# Patient Record
Sex: Female | Born: 1960 | Race: White | Hispanic: No | Smoking: Current every day smoker
Health system: Southern US, Community
[De-identification: ages and names within clinical notes are randomized; demographics above are authoritative.]

## PROBLEM LIST (undated history)

## (undated) DIAGNOSIS — I10 Essential (primary) hypertension: Secondary | ICD-10-CM

---

## 2006-02-02 ENCOUNTER — Inpatient Hospital Stay (HOSPITAL_COMMUNITY): Admission: AD | Admit: 2006-02-02 | Discharge: 2006-02-02 | Payer: Self-pay | Admitting: *Deleted

## 2006-09-16 ENCOUNTER — Emergency Department (HOSPITAL_COMMUNITY): Admission: EM | Admit: 2006-09-16 | Discharge: 2006-09-16 | Payer: Self-pay | Admitting: Family Medicine

## 2009-10-19 ENCOUNTER — Emergency Department (HOSPITAL_COMMUNITY): Admission: EM | Admit: 2009-10-19 | Discharge: 2009-10-19 | Payer: Self-pay | Admitting: Emergency Medicine

## 2017-06-05 ENCOUNTER — Encounter (HOSPITAL_COMMUNITY): Payer: Self-pay

## 2017-06-05 ENCOUNTER — Emergency Department (HOSPITAL_COMMUNITY): Payer: BLUE CROSS/BLUE SHIELD

## 2017-06-05 ENCOUNTER — Emergency Department (HOSPITAL_COMMUNITY)
Admission: EM | Admit: 2017-06-05 | Discharge: 2017-06-05 | Disposition: A | Discharge status: Home / Self Care | Payer: BLUE CROSS/BLUE SHIELD | Attending: Emergency Medicine | Admitting: Emergency Medicine

## 2017-06-05 DIAGNOSIS — N390 Urinary tract infection, site not specified: Secondary | ICD-10-CM | POA: Diagnosis not present

## 2017-06-05 DIAGNOSIS — F17200 Nicotine dependence, unspecified, uncomplicated: Secondary | ICD-10-CM | POA: Diagnosis not present

## 2017-06-05 DIAGNOSIS — M549 Dorsalgia, unspecified: Secondary | ICD-10-CM | POA: Diagnosis present

## 2017-06-05 LAB — URINALYSIS, ROUTINE W REFLEX MICROSCOPIC
Bilirubin Urine: NEGATIVE
GLUCOSE, UA: NEGATIVE mg/dL
HGB URINE DIPSTICK: NEGATIVE
Ketones, ur: 5 mg/dL — AB
Leukocytes, UA: NEGATIVE
NITRITE: POSITIVE — AB
PH: 6 (ref 5.0–8.0)
PROTEIN: NEGATIVE mg/dL
SPECIFIC GRAVITY, URINE: 1.011 (ref 1.005–1.030)

## 2017-06-05 LAB — COMPREHENSIVE METABOLIC PANEL
ALK PHOS: 59 U/L (ref 38–126)
ALT: 13 U/L — ABNORMAL LOW (ref 14–54)
ANION GAP: 6 (ref 5–15)
AST: 19 U/L (ref 15–41)
Albumin: 4 g/dL (ref 3.5–5.0)
BILIRUBIN TOTAL: 0.6 mg/dL (ref 0.3–1.2)
BUN: 13 mg/dL (ref 6–20)
CALCIUM: 8.9 mg/dL (ref 8.9–10.3)
CO2: 26 mmol/L (ref 22–32)
Chloride: 105 mmol/L (ref 101–111)
Creatinine, Ser: 0.69 mg/dL (ref 0.44–1.00)
GFR calc Af Amer: >60 mL/min (ref >60)
GLUCOSE: 105 mg/dL — AB (ref 65–99)
POTASSIUM: 3.7 mmol/L (ref 3.5–5.1)
Sodium: 137 mmol/L (ref 135–145)
TOTAL PROTEIN: 7 g/dL (ref 6.5–8.1)

## 2017-06-05 LAB — CBC WITH DIFFERENTIAL/PLATELET
BASOS PCT: 1 %
Basophils Absolute: 0.1 10*3/uL (ref 0.0–0.1)
EOS ABS: 0.1 10*3/uL (ref 0.0–0.7)
EOS PCT: 1 %
HCT: 42.8 % (ref 36.0–46.0)
HEMOGLOBIN: 14.9 g/dL (ref 12.0–15.0)
Lymphocytes Relative: 38 %
Lymphs Abs: 2.6 10*3/uL (ref 0.7–4.0)
MCH: 31.7 pg (ref 26.0–34.0)
MCHC: 34.8 g/dL (ref 30.0–36.0)
MCV: 91.1 fL (ref 78.0–100.0)
MONOS PCT: 8 %
Monocytes Absolute: 0.5 10*3/uL (ref 0.1–1.0)
NEUTROS PCT: 52 %
Neutro Abs: 3.6 10*3/uL (ref 1.7–7.7)
PLATELETS: 271 10*3/uL (ref 150–400)
RBC: 4.7 MIL/uL (ref 3.87–5.11)
RDW: 13.3 % (ref 11.5–15.5)
WBC: 6.8 10*3/uL (ref 4.0–10.5)

## 2017-06-05 LAB — LIPASE, BLOOD: LIPASE: 24 U/L (ref 11–51)

## 2017-06-05 LAB — D-DIMER, QUANTITATIVE: D-Dimer, Quant: 0.28 ug/mL-FEU (ref 0.00–0.50)

## 2017-06-05 LAB — TROPONIN I: Troponin I: <0.03 ng/mL (ref <0.03)

## 2017-06-05 MED ORDER — DEXTROSE 5 % IV SOLN
1.0000 g | Freq: Once | INTRAVENOUS | Status: AC
  Administered 2017-06-05: 1 g via INTRAVENOUS
  Filled 2017-06-05: qty 10

## 2017-06-05 MED ORDER — ONDANSETRON HCL 4 MG/2ML IJ SOLN
4.0000 mg | INTRAMUSCULAR | Status: DC | PRN
  Administered 2017-06-05: 4 mg via INTRAVENOUS
  Filled 2017-06-05: qty 2

## 2017-06-05 MED ORDER — FAMOTIDINE IN NACL 20-0.9 MG/50ML-% IV SOLN
20.0000 mg | Freq: Once | INTRAVENOUS | Status: AC
  Administered 2017-06-05: 20 mg via INTRAVENOUS
  Filled 2017-06-05: qty 50

## 2017-06-05 MED ORDER — ONDANSETRON 4 MG PO TBDP: 4.0000 mg | ORAL_TABLET | Freq: Three times a day (TID) | ORAL | 0 refills | Status: DC | PRN

## 2017-06-05 MED ORDER — CEPHALEXIN 500 MG PO CAPS: 500.0000 mg | ORAL_CAPSULE | Freq: Four times a day (QID) | ORAL | 0 refills | Status: DC

## 2017-06-05 MED ORDER — MORPHINE SULFATE (PF) 4 MG/ML IV SOLN
4.0000 mg | INTRAVENOUS | Status: DC | PRN
  Administered 2017-06-05: 4 mg via INTRAVENOUS
  Filled 2017-06-05: qty 1

## 2017-06-05 MED ORDER — HYDROCODONE-ACETAMINOPHEN 5-325 MG PO TABS: ORAL_TABLET | ORAL | 0 refills | Status: DC

## 2017-06-05 NOTE — ED Provider Notes (Signed)
[facility] Provider Note   CSN: [id number] Arrival date & time: 06/05/17  0702     History   Chief Complaint Chief Complaint  Patient presents with  . Back Pain    HPI Krista Wallace is a 56 y.o. female.   Back Pain      Pt was seen at 0725. Per pt, c/o gradual onset and worsening of persistent mid-back "pain" since yesterday morning. Pt describes the pain as "cramping." Has been associated with decreased PO intake, as well as several episodes of N/V. Denies abd pain, no CP/SOB, no diarrhea, no fevers, no black or blood in stools or emesis, no injury.    History reviewed. No pertinent past medical history.  There are no active problems to display for this patient.   History reviewed. No pertinent surgical history.  OB History    No data available       Home Medications    Prior to Admission medications   Not on File    Family History No family history on file.  Social History Social History  Substance Use Topics  . Smoking status: Current Every Day Smoker    Packs/day: 0.50  . Smokeless tobacco: Never Used  . Alcohol use No     Allergies   Patient has no allergy information on record.   Review of Systems Review of Systems  Musculoskeletal: Positive for back pain.  ROS: Statement: All systems negative except as marked or noted in the HPI; Constitutional: Negative for fever and chills. ; ; Eyes: Negative for eye pain, redness and discharge. ; ; ENMT: Negative for ear pain, hoarseness, nasal congestion, sinus pressure and sore throat. ; ; Cardiovascular: Negative for chest pain, palpitations, diaphoresis, dyspnea and peripheral edema. ; ; Respiratory: Negative for cough, wheezing and stridor. ; ; Gastrointestinal: +N/V. Negative for diarrhea, abdominal pain, blood in stool, hematemesis, jaundice and rectal bleeding. . ; ; Genitourinary: Negative for dysuria, flank pain and hematuria. ; ; Musculoskeletal: +back pain. Negative for neck pain.  Negative for swelling and trauma.; ; Skin: Negative for pruritus, rash, abrasions, blisters, bruising and skin lesion.; ; Neuro: Negative for headache, lightheadedness and neck stiffness. Negative for weakness, altered level of consciousness, altered mental status, extremity weakness, paresthesias, involuntary movement, seizure and syncope.        Physical Exam Updated Vital Signs BP 137/74 (BP Location: Right Arm)   Pulse (!) 58   Temp 97.8 F (36.6 C) (Oral)   Ht 5\' 2"  (1.575 m)   Wt 67.1 kg (148 lb)   SpO2 99%   BMI 27.07 kg/m   Physical Exam 0730: Physical examination:  Nursing notes reviewed; Vital signs and O2 SAT reviewed;  Constitutional: Well developed, Well nourished, Well hydrated, In no acute distress; Head:  Normocephalic, atraumatic; Eyes: EOMI, PERRL, No scleral icterus; ENMT: Mouth and pharynx normal, Mucous membranes moist; Neck: Supple, Full range of motion, No lymphadenopathy; Cardiovascular: Regular rate and rhythm, No gallop; Respiratory: Breath sounds clear & equal bilaterally, No wheezes.  Speaking full sentences with ease, Normal respiratory effort/excursion; Chest: Nontender, Movement normal; Abdomen: Soft, Nontender, Nondistended, Normal bowel sounds; Genitourinary: No CVA tenderness; Spine:  No midline CS, TS, LS tenderness. +mild TTP lower thoracic paraspinal muscles.;; Extremities: Pulses normal, No tenderness, No edema, No calf edema or asymmetry.; Neuro: AA&Ox3, Major CN grossly intact.  Speech clear. No gross focal motor or sensory deficits in extremities.; Skin: Color normal, Warm, Dry.   ED Treatments / Results  Labs (all labs ordered  are listed, but only abnormal results are displayed)   EKG  EKG Interpretation  Date/Time:  Friday June 05 2017 07:24:33 EDT Ventricular Rate:  64 PR Interval:    QRS Duration: 82 QT Interval:  408 QTC Calculation: 421 R Axis:   75 Text Interpretation:  Sinus rhythm No old tracing to compare Confirmed by Samuel Jester [phone]) on 06/05/2017 7:32:33 AM       Radiology   Procedures Procedures (including critical care time)  Medications Ordered in ED Medications  morphine 4 MG/ML injection 4 mg (not administered)  ondansetron (ZOFRAN) injection 4 mg (not administered)  famotidine (PEPCID) IVPB 20 mg premix (not administered)     Initial Impression / Assessment and Plan / ED Course  I have reviewed the triage vital signs and the nursing notes.  Pertinent labs & imaging results that were available during my care of the patient were reviewed by me and considered in my medical decision making (see chart for details).  MDM Reviewed: previous chart, nursing note and vitals Reviewed previous: labs and ECG Interpretation: labs, ECG and x-ray   Results for orders placed or performed during the hospital encounter of 06/05/17  Comprehensive metabolic panel  Result Value Ref Range   Sodium 137 135 - 145 mmol/L   Potassium 3.7 3.5 - 5.1 mmol/L   Chloride 105 101 - 111 mmol/L   CO2 26 22 - 32 mmol/L   Glucose, Bld 105 (H) 65 - 99 mg/dL   BUN 13 6 - 20 mg/dL   Creatinine, Ser 4.09 0.44 - 1.00 mg/dL   Calcium 8.9 8.9 - 81.1 mg/dL   Total Protein 7.0 6.5 - 8.1 g/dL   Albumin 4.0 3.5 - 5.0 g/dL   AST 19 15 - 41 U/L   ALT 13 (L) 14 - 54 U/L   Alkaline Phosphatase 59 38 - 126 U/L   Total Bilirubin 0.6 0.3 - 1.2 mg/dL   GFR calc non Af Amer >60 >60 mL/min   GFR calc Af Amer >60 >60 mL/min   Anion gap 6 5 - 15  Lipase, blood  Result Value Ref Range   Lipase 24 11 - 51 U/L  Troponin I  Result Value Ref Range   Troponin I <0.03 <0.03 ng/mL  CBC with Differential  Result Value Ref Range   WBC 6.8 4.0 - 10.5 K/uL   RBC 4.70 3.87 - 5.11 MIL/uL   Hemoglobin 14.9 12.0 - 15.0 g/dL   HCT 91.4 78.2 - 95.6 %   MCV 91.1 78.0 - 100.0 fL   MCH 31.7 26.0 - 34.0 pg   MCHC 34.8 30.0 - 36.0 g/dL   RDW 21.3 08.6 - 57.8 %   Platelets 271 150 - 400 K/uL   Neutrophils Relative % 52 %   Neutro Abs 3.6  1.7 - 7.7 K/uL   Lymphocytes Relative 38 %   Lymphs Abs 2.6 0.7 - 4.0 K/uL   Monocytes Relative 8 %   Monocytes Absolute 0.5 0.1 - 1.0 K/uL   Eosinophils Relative 1 %   Eosinophils Absolute 0.1 0.0 - 0.7 K/uL   Basophils Relative 1 %   Basophils Absolute 0.1 0.0 - 0.1 K/uL  Urinalysis, Routine w reflex microscopic  Result Value Ref Range   Color, Urine YELLOW YELLOW   APPearance HAZY (A) CLEAR   Specific Gravity, Urine 1.011 1.005 - 1.030   pH 6.0 5.0 - 8.0   Glucose, UA NEGATIVE NEGATIVE mg/dL   Hgb urine dipstick NEGATIVE NEGATIVE  Bilirubin Urine NEGATIVE NEGATIVE   Ketones, ur 5 (A) NEGATIVE mg/dL   Protein, ur NEGATIVE NEGATIVE mg/dL   Nitrite POSITIVE (A) NEGATIVE   Leukocytes, UA NEGATIVE NEGATIVE   RBC / HPF 0-5 0 - 5 RBC/hpf   WBC, UA 0-5 0 - 5 WBC/hpf   Bacteria, UA MANY (A) NONE SEEN   Squamous Epithelial / LPF 0-5 (A) NONE SEEN   Mucous PRESENT   D-dimer, quantitative  Result Value Ref Range   D-Dimer, Quant 0.28 0.00 - 0.50 ug/mL-FEU   Dg Chest 2 View Result Date: 06/05/2017 CLINICAL DATA:  Mid back pain EXAM: CHEST  2 VIEW COMPARISON:  None. FINDINGS: Normal heart size and mediastinal contours. No acute infiltrate or edema. No effusion or pneumothorax. No acute osseous findings. IMPRESSION: No evidence of active disease. Electronically Signed   By: Marnee SpringJonathon  Watts M.D.   On: 06/05/2017 08:41    1005:  Will tx for UTI with rocephin/keflex. Workup otherwise reassuring. Dx and testing d/w pt.  Questions answered.  Verb understanding, agreeable to d/c home with outpt f/u.   Final Clinical Impressions(s) / ED Diagnoses   Final diagnoses:  None    New Prescriptions New Prescriptions   No medications on file      Samuel JesterMcManus, Amandalee Lacap, DO 06/10/17 1517

## 2017-06-05 NOTE — ED Triage Notes (Addendum)
Pt reports that she woke up yesterday w mid pain soreness. Pain increased throughout day. Pt tried ibuprofen and bengay without relief. No injury. Reports vomiting yellow substance several. States she had several episodes of epigastric pain. Described as tightness

## 2017-06-05 NOTE — Discharge Instructions (Signed)
Take the prescriptions as directed.  Call your regular medical doctor today to schedule a follow up appointment within the next 3 days.  Return to the Emergency Department immediately sooner if worsening.  ° °

## 2020-07-24 ENCOUNTER — Ambulatory Visit
Admission: EM | Admit: 2020-07-24 | Discharge: 2020-07-24 | Disposition: A | Discharge status: Home / Self Care | Payer: BC Managed Care – PPO | Attending: Emergency Medicine | Admitting: Emergency Medicine

## 2020-07-24 ENCOUNTER — Other Ambulatory Visit: Payer: Self-pay

## 2020-07-24 DIAGNOSIS — R300 Dysuria: Secondary | ICD-10-CM | POA: Diagnosis present

## 2020-07-24 DIAGNOSIS — M545 Low back pain, unspecified: Secondary | ICD-10-CM

## 2020-07-24 LAB — POCT URINALYSIS DIP (MANUAL ENTRY)
Bilirubin, UA: NEGATIVE
Blood, UA: NEGATIVE
Glucose, UA: NEGATIVE mg/dL
Ketones, POC UA: NEGATIVE mg/dL
Leukocytes, UA: NEGATIVE
Nitrite, UA: NEGATIVE
Protein Ur, POC: NEGATIVE mg/dL
Spec Grav, UA: 1.025 (ref 1.010–1.025)
Urobilinogen, UA: 0.2 E.U./dL
pH, UA: 5.5 (ref 5.0–8.0)

## 2020-07-24 MED ORDER — PREDNISONE 20 MG PO TABS: 20.0000 mg | ORAL_TABLET | Freq: Two times a day (BID) | ORAL | 0 refills | Status: AC

## 2020-07-24 MED ORDER — DEXAMETHASONE SODIUM PHOSPHATE 10 MG/ML IJ SOLN
10.0000 mg | Freq: Once | INTRAMUSCULAR | Status: AC
  Administered 2020-07-24: 11:00:00 10 mg via INTRAMUSCULAR

## 2020-07-24 MED ORDER — CYCLOBENZAPRINE HCL 10 MG PO TABS: 10.0000 mg | ORAL_TABLET | Freq: Every day | ORAL | 0 refills | Status: DC

## 2020-07-24 NOTE — Discharge Instructions (Signed)
Urine without infection Steroid shot given Continue conservative management of rest, ice, and gentle stretches Prednisone prescribed.  Take as directed and to completion Take cyclobenzaprine at nighttime for symptomatic relief. Avoid driving or operating heavy machinery while using medication. Follow up with PCP if symptoms persist Return or go to the ER if you have any new or worsening symptoms (fever, chills, chest pain, abdominal pain, changes in bowel or bladder habits, pain radiating into lower legs, etc...)

## 2020-07-24 NOTE — ED Provider Notes (Signed)
[facility]   [id number] 07/24/20 Arrival Time: 1045  CC: back PAIN  SUBJECTIVE: History from: patient. Krista Wallace is a 59 y.o. female complains of low back pain x 4 days.  Denies a precipitating event or specific injury.  Localizes the pain to the mid to low back.  Describes the pain as constant and achy in character.  Has tried OTC medications without relief.  Denies aggravating factors.  Denies similar symptoms in the past.  Complains of slight dysuria.  Denies fever, chills, erythema, ecchymosis, effusion, weakness, numbness and tingling, saddle paresthesias, loss of bowel or bladder function, urinary frequency, urinary urgency.      ROS: As per HPI.  All other pertinent ROS negative.     History reviewed. No pertinent past medical history. History reviewed. No pertinent surgical history. No Known Allergies No current facility-administered medications on file prior to encounter.   Current Outpatient Medications on File Prior to Encounter  Medication Sig Dispense Refill  . ibuprofen (ADVIL,MOTRIN) 200 MG tablet Take 800 mg by mouth every 6 (six) hours as needed.    . Multiple Vitamins-Minerals (HAIR SKIN AND NAILS FORMULA PO) Take 1 tablet by mouth daily.    . ondansetron (ZOFRAN ODT) 4 MG disintegrating tablet Take 1 tablet (4 mg total) by mouth every 8 (eight) hours as needed for nausea or vomiting. 6 tablet 0   Social History   Socioeconomic History  . Marital status: Legally Separated    Spouse name: Not on file  . Number of children: Not on file  . Years of education: Not on file  . Highest education level: Not on file  Occupational History  . Not on file  Tobacco Use  . Smoking status: Current Every Day Smoker    Packs/day: 0.50  . Smokeless tobacco: Never Used  Substance and Sexual Activity  . Alcohol use: No  . Drug use: Not on file  . Sexual activity: Not on file  Other Topics Concern  . Not on file  Social History Narrative  . Not on file     Social Determinants of Health   Financial Resource Strain:   . Difficulty of Paying Living Expenses: Not on file  Food Insecurity:   . Worried About Programme researcher, broadcasting/film/video in the Last Year: Not on file  . Ran Out of Food in the Last Year: Not on file  Transportation Needs:   . Lack of Transportation (Medical): Not on file  . Lack of Transportation (Non-Medical): Not on file  Physical Activity:   . Days of Exercise per Week: Not on file  . Minutes of Exercise per Session: Not on file  Stress:   . Feeling of Stress : Not on file  Social Connections:   . Frequency of Communication with Friends and Family: Not on file  . Frequency of Social Gatherings with Friends and Family: Not on file  . Attends Religious Services: Not on file  . Active Member of Clubs or Organizations: Not on file  . Attends Banker Meetings: Not on file  . Marital Status: Not on file  Intimate Partner Violence:   . Fear of Current or Ex-Partner: Not on file  . Emotionally Abused: Not on file  . Physically Abused: Not on file  . Sexually Abused: Not on file   History reviewed. No pertinent family history.  OBJECTIVE:  Vitals:   07/24/20 1057  BP: 107/73  Pulse: 94  Resp: 18  Temp: 98.7 F (37.1 C)  SpO2: 96%    General appearance: ALERT; in no acute distress.  Head: NCAT Lungs: Normal respiratory effort Musculoskeletal: Back Inspection: Skin warm, dry, clear and intact without obvious erythema, effusion, or ecchymosis.  Palpation: diffusely TTP over mid to low back paravertebral muscles; no CVA tenderness ROM: FROM active and passive Strength: 5/5 shld abduction, 5/5 shld adduction, 5/5 elbow flexion, 5/5 elbow extension, 5/5 grip strength, 5/5 hip flexion, 5/5 hip extension Skin: warm and dry Neurologic: Ambulates without difficulty Psychological: alert and cooperative; normal mood and affect  LABS:  Results for orders placed or performed during the hospital encounter of 07/24/20  (from the past 24 hour(s))  POCT urinalysis dipstick     Status: None   Collection Time: 07/24/20 11:02 AM  Result Value Ref Range   Color, UA yellow yellow   Clarity, UA clear clear   Glucose, UA negative negative mg/dL   Bilirubin, UA negative negative   Ketones, POC UA negative negative mg/dL   Spec Grav, UA 4.098 1.191 - 1.025   Blood, UA negative negative   pH, UA 5.5 5.0 - 8.0   Protein Ur, POC negative negative mg/dL   Urobilinogen, UA 0.2 0.2 or 1.0 E.U./dL   Nitrite, UA Negative Negative   Leukocytes, UA Negative Negative     ASSESSMENT & PLAN:  1. Acute bilateral low back pain without sciatica   2. Dysuria     Meds ordered this encounter  Medications  . predniSONE (DELTASONE) 20 MG tablet    Sig: Take 1 tablet (20 mg total) by mouth 2 (two) times daily with a meal for 5 days.    Dispense:  10 tablet    Refill:  0    Order Specific Question:   Supervising Provider    Answer:   Eustace Moore [[id number]]  . cyclobenzaprine (FLEXERIL) 10 MG tablet    Sig: Take 1 tablet (10 mg total) by mouth at bedtime.    Dispense:  15 tablet    Refill:  0    Order Specific Question:   Supervising Provider    Answer:   Eustace Moore [[id number]]  . dexamethasone (DECADRON) injection 10 mg   Urine without infection Steroid shot given Continue conservative management of rest, ice, and gentle stretches Prednisone prescribed.  Take as directed and to completion Take cyclobenzaprine at nighttime for symptomatic relief. Avoid driving or operating heavy machinery while using medication. Follow up with PCP if symptoms persist Return or go to the ER if you have any new or worsening symptoms (fever, chills, chest pain, abdominal pain, changes in bowel or bladder habits, pain radiating into lower legs, etc...)   Reviewed expectations re: course of current medical issues. Questions answered. Outlined signs and symptoms indicating need for more acute intervention. Patient verbalized  understanding. After Visit Summary given.    Krista Harding, PA-C 07/24/20 1122

## 2020-07-24 NOTE — ED Triage Notes (Signed)
Pt presents with low back pain that she feels may be kidneys, states that is not muscular pain. No dysuria, symptoms began on Friday.

## 2020-07-25 LAB — URINE CULTURE

## 2021-02-21 ENCOUNTER — Ambulatory Visit
Admission: EM | Admit: 2021-02-21 | Discharge: 2021-02-21 | Disposition: A | Discharge status: Home / Self Care | Payer: BC Managed Care – PPO | Attending: Family Medicine | Admitting: Family Medicine

## 2021-02-21 ENCOUNTER — Other Ambulatory Visit: Payer: Self-pay

## 2021-02-21 ENCOUNTER — Encounter: Payer: Self-pay | Admitting: Emergency Medicine

## 2021-02-21 DIAGNOSIS — J0100 Acute maxillary sinusitis, unspecified: Secondary | ICD-10-CM

## 2021-02-21 DIAGNOSIS — J209 Acute bronchitis, unspecified: Secondary | ICD-10-CM | POA: Diagnosis not present

## 2021-02-21 MED ORDER — DOXYCYCLINE HYCLATE 100 MG PO CAPS: 100.0000 mg | ORAL_CAPSULE | Freq: Two times a day (BID) | ORAL | 0 refills | Status: AC

## 2021-02-21 MED ORDER — PREDNISONE 20 MG PO TABS: 20.0000 mg | ORAL_TABLET | Freq: Every day | ORAL | 0 refills | Status: AC

## 2021-02-21 NOTE — ED Provider Notes (Signed)
[facility]    CSN: [id number] Arrival date & time: 02/21/21  1729      History   Chief Complaint Chief Complaint  Patient presents with  . Otalgia    HPI Krista ROCCHIO is a 60 y.o. female.   HPI  Patient presents with concern for possible sinus infection. Onset: several days. Endorse facial pressure, ST, frontal headache, ear pain.  Denies fever, chest tightness, eye irritation,  or GI symptoms. Attempted relief with OTC medication without any relief of symptoms. Patient is a smoker.    Remainder of Review of Systems negative except as noted in the HPI.   History reviewed. No pertinent past medical history.  There are no problems to display for this patient.   History reviewed. No pertinent surgical history.  OB History   No obstetric history on file.      Home Medications    Prior to Admission medications   Medication Sig Start Date End Date Taking? Authorizing Provider  doxycycline (VIBRAMYCIN) 100 MG capsule Take 1 capsule (100 mg total) by mouth 2 (two) times daily for 10 days. 02/21/21 03/03/21 Yes Bing Neighbors, FNP  predniSONE (DELTASONE) 20 MG tablet Take 1 tablet (20 mg total) by mouth daily with breakfast for 5 days. 02/21/21 02/26/21 Yes Bing Neighbors, FNP  cyclobenzaprine (FLEXERIL) 10 MG tablet Take 1 tablet (10 mg total) by mouth at bedtime. 07/24/20   Wurst, [place], PA-C  ibuprofen (ADVIL,MOTRIN) 200 MG tablet Take 800 mg by mouth every 6 (six) hours as needed.    [provider]  Multiple Vitamins-Minerals (HAIR SKIN AND NAILS FORMULA PO) Take 1 tablet by mouth daily.    [provider]  ondansetron (ZOFRAN ODT) 4 MG disintegrating tablet Take 1 tablet (4 mg total) by mouth every 8 (eight) hours as needed for nausea or vomiting. 06/05/17   Samuel Jester, DO    Family History No family history on file.  Social History Social History   Tobacco Use  . Smoking status: Current Every Day Smoker     Packs/day: 0.50  . Smokeless tobacco: Never Used  Substance Use Topics  . Alcohol use: No  . Drug use: Never     Allergies   Patient has no known allergies.   Review of Systems Review of Systems Pertinent negatives listed in HPI   Physical Exam Triage Vital Signs ED Triage Vitals  Enc Vitals Group     BP 02/21/21 1743 105/70     Pulse Rate 02/21/21 1743 (!) 107     Resp 02/21/21 1743 18     Temp 02/21/21 1743 98.8 F (37.1 C)     Temp Source 02/21/21 1743 Oral     SpO2 02/21/21 1743 93 %     Weight --      Height --      Head Circumference --      Peak Flow --      Pain Score 02/21/21 1742 4     Pain Loc --      Pain Edu? --      Excl. in GC? --    No data found.  Updated Vital Signs BP 105/70 (BP Location: Right Arm)   Pulse (!) 107   Temp 98.8 F (37.1 C) (Oral)   Resp 18   SpO2 93%   Visual Acuity Right Eye Distance:   Left Eye Distance:   Bilateral Distance:    Right Eye Near:   Left Eye Near:  Bilateral Near:     Physical Exam  General Appearance:    Alert, cooperative, no distress  HENT:   Normocephalic, ears normal, nares mucosal edema with congestion, rhinorrhea, oropharynx clear   Eyes:    PERRL, conjunctiva/corneas clear, EOM's intact       Lungs:    Coarse lung sounds without audible wheezing, respirations unlabored  Heart:    Regular rate and rhythm  Neurologic:   Awake, alert, oriented x 3. No apparent focal neurological           defect.      UC Treatments / Results  Labs (all labs ordered are listed, but only abnormal results are displayed) Labs Reviewed - No data to display  EKG   Radiology No results found.  Procedures Procedures (including critical care time)  Medications Ordered in UC Medications - No data to display  Initial Impression / Assessment and Plan / UC Course  I have reviewed the triage vital signs and the nursing notes.  Pertinent labs & imaging results that were available during my care of the  patient were reviewed by me and considered in my medical decision making (see chart for details).    Acute sinusitis with bronchitis.  Treatment per discharge medication orders.  Force fluids.  ER precautions if develops severe shortness of breath or wheezing.  Follow-up with PCP as needed.  Final Clinical Impressions(s) / UC Diagnoses   Final diagnoses:  Acute bronchitis, unspecified organism  Acute non-recurrent maxillary sinusitis   Discharge Instructions   None    ED Prescriptions    Medication Sig Dispense Auth. Provider   doxycycline (VIBRAMYCIN) 100 MG capsule Take 1 capsule (100 mg total) by mouth 2 (two) times daily for 10 days. 20 capsule Bing Neighbors, FNP   predniSONE (DELTASONE) 20 MG tablet Take 1 tablet (20 mg total) by mouth daily with breakfast for 5 days. 5 tablet Bing Neighbors, FNP     PDMP not reviewed this encounter.   Bing Neighbors, FNP 02/25/21 2051

## 2021-02-21 NOTE — ED Triage Notes (Signed)
Sinus headache, sore throat and LT ear pain for past few days. Reports she has used Claritin, sudafed with no relief.

## 2021-07-17 ENCOUNTER — Other Ambulatory Visit: Payer: Self-pay

## 2021-07-17 ENCOUNTER — Ambulatory Visit
Admission: EM | Admit: 2021-07-17 | Discharge: 2021-07-17 | Disposition: A | Discharge status: Home / Self Care | Payer: BC Managed Care – PPO

## 2021-07-17 ENCOUNTER — Encounter: Payer: Self-pay | Admitting: *Deleted

## 2021-07-17 DIAGNOSIS — K439 Ventral hernia without obstruction or gangrene: Secondary | ICD-10-CM

## 2021-07-17 NOTE — ED Triage Notes (Signed)
Pt reports a painful knot that is internal and located above the belly button. On observation no raised skin  seen. No redness noted on skin

## 2021-07-17 NOTE — Discharge Instructions (Addendum)
Go to the Emergency department if increased swelling or redness

## 2021-07-19 NOTE — ED Provider Notes (Signed)
[facility]    CSN: [id number] Arrival date & time: 07/17/21  1547      History   Chief Complaint Chief Complaint  Patient presents with   internal painful knot     HPI Krista Wallace is a 60 y.o. female.   Pt complains of a knot in her upper abdomen.  Pt complains of soreness  The history is provided by the patient.  Abdominal Pain Pain location:  Generalized Pain quality: aching   Pain radiates to:  Does not radiate Pain severity:  Moderate Onset quality:  Gradual Timing:  Constant Progression:  Worsening Chronicity:  New Relieved by:  Nothing Worsened by:  Nothing Ineffective treatments:  None tried Associated symptoms: no fever and no vomiting    History reviewed. No pertinent past medical history.  There are no problems to display for this patient.   History reviewed. No pertinent surgical history.  OB History   No obstetric history on file.      Home Medications    Prior to Admission medications   Medication Sig Start Date End Date Taking? Authorizing Provider  cyclobenzaprine (FLEXERIL) 10 MG tablet Take 1 tablet (10 mg total) by mouth at bedtime. 07/24/20   Wurst, [place], PA-C  ibuprofen (ADVIL,MOTRIN) 200 MG tablet Take 800 mg by mouth every 6 (six) hours as needed.    [provider]  Multiple Vitamins-Minerals (HAIR SKIN AND NAILS FORMULA PO) Take 1 tablet by mouth daily.    [provider]  ondansetron (ZOFRAN ODT) 4 MG disintegrating tablet Take 1 tablet (4 mg total) by mouth every 8 (eight) hours as needed for nausea or vomiting. 06/05/17   Samuel Jester, DO    Family History History reviewed. No pertinent family history.  Social History Social History   Tobacco Use   Smoking status: Every Day    Packs/day: 0.50    Types: Cigarettes   Smokeless tobacco: Never  Substance Use Topics   Alcohol use: No   Drug use: Never     Allergies   Other   Review of Systems Review of Systems   Constitutional:  Negative for fever.  Gastrointestinal:  Positive for abdominal pain. Negative for vomiting.  All other systems reviewed and are negative.   Physical Exam Triage Vital Signs ED Triage Vitals [07/17/21 1626]  Enc Vitals Group     BP 131/87     Pulse Rate 78     Resp 18     Temp 98.3 F (36.8 C)     Temp src      SpO2 95 %     Weight      Height      Head Circumference      Peak Flow      Pain Score 7     Pain Loc      Pain Edu?      Excl. in GC?    No data found.  Updated Vital Signs BP 131/87   Pulse 78   Temp 98.3 F (36.8 C)   Resp 18   SpO2 95%   Visual Acuity Right Eye Distance:   Left Eye Distance:   Bilateral Distance:    Right Eye Near:   Left Eye Near:    Bilateral Near:     Physical Exam Vitals reviewed.  Cardiovascular:     Rate and Rhythm: Normal rate.  Pulmonary:     Effort: Pulmonary effort is normal.  Abdominal:     General: Abdomen is  flat.     Tenderness: There is abdominal tenderness.  Skin:    General: Skin is warm.  Neurological:     General: No focal deficit present.     Mental Status: She is alert.  Psychiatric:        Mood and Affect: Mood normal.     UC Treatments / Results  Labs (all labs ordered are listed, but only abnormal results are displayed) Labs Reviewed - No data to display  EKG   Radiology No results found.  Procedures Procedures (including critical care time)  Medications Ordered in UC Medications - No data to display  Initial Impression / Assessment and Plan / UC Course  I have reviewed the triage vital signs and the nursing notes.  Pertinent labs & imaging results that were available during my care of the patient were reviewed by me and considered in my medical decision making (see chart for details).     MDM:  Pt counseled on hernia's.  Pt advised to schedule to see Surgeon for evaluation, Pt advised to go to ED if area develops redness, increased bulging or pain Final  Clinical Impressions(s) / UC Diagnoses   Final diagnoses:  Hernia of abdominal wall     Discharge Instructions      Go to the Emergency department if increased swelling or redness    ED Prescriptions   None    PDMP not reviewed this encounter. An After Visit Summary was printed and given to the patient.    Elson Areas, [place] 07/19/21 1255

## 2021-08-01 ENCOUNTER — Encounter: Payer: Self-pay | Admitting: General Surgery

## 2021-08-01 ENCOUNTER — Other Ambulatory Visit: Payer: Self-pay

## 2021-08-01 ENCOUNTER — Ambulatory Visit: Payer: BC Managed Care – PPO | Admitting: General Surgery

## 2021-08-01 VITALS — BP 123/76 | HR 82 | Temp 98.5°F | Resp 16 | Ht 62.0 in | Wt 173.0 lb

## 2021-08-01 DIAGNOSIS — R1033 Periumbilical pain: Secondary | ICD-10-CM | POA: Diagnosis not present

## 2021-08-01 NOTE — Progress Notes (Signed)
[facility] History and Physical  Reason for Referral: Epigastric pain/ supraumbilical pain, possible hernia  Referring Physician: ED   Chief Complaint   New Patient (Initial Visit)     Krista Wallace is a 60 y.o. female.  HPI: Krista Wallace is a 60 yo who went to the ED for upper abdominal pain and some associated SOB. She felt this knot at the top her her abdomen and the ED felt like it was a hernia. She had no imaging. She was sent to me for further management. She says that the area is still present but it is only painful with palpation at this time.    History reviewed. No pertinent past medical history.  History reviewed. No pertinent surgical history.  History reviewed. No pertinent family history.  Social History   Tobacco Use   Smoking status: Every Day    Packs/day: 0.50    Types: Cigarettes   Smokeless tobacco: Never  Substance Use Topics   Alcohol use: No   Drug use: Never    Medications: I have reviewed the patient's current medications. Allergies as of 08/01/2021       Reactions   Other Anaphylaxis   OSB Board        Medication List        Accurate as of August 01, 2021 11:59 PM. If you have any questions, ask your nurse or doctor.          cyclobenzaprine 10 MG tablet Commonly known as: FLEXERIL Take 1 tablet (10 mg total) by mouth at bedtime.   HAIR SKIN AND NAILS FORMULA PO Take 1 tablet by mouth daily.   ibuprofen 200 MG tablet Commonly known as: ADVIL Take 800 mg by mouth every 6 (six) hours as needed.   ondansetron 4 MG disintegrating tablet Commonly known as: Zofran ODT Take 1 tablet (4 mg total) by mouth every 8 (eight) hours as needed for nausea or vomiting.         ROS:  A comprehensive review of systems was negative except for: Gastrointestinal: positive for knot in the supraumbilical region  Blood pressure 123/76, pulse 82, temperature 98.5 F (36.9 C), temperature source Other (Comment),  resp. rate 16, height 5\' 2"  (1.575 m), weight 173 lb (78.5 kg), SpO2 92 %. Physical Exam Vitals reviewed.  Constitutional:      Appearance: Normal appearance.  HENT:     Head: Normocephalic.     Nose: Nose normal.     Mouth/Throat:     Mouth: Mucous membranes are moist.  Eyes:     Extraocular Movements: Extraocular movements intact.  Cardiovascular:     Rate and Rhythm: Normal rate and regular rhythm.  Pulmonary:     Effort: Pulmonary effort is normal.     Breath sounds: Normal breath sounds.  Abdominal:     General: There is no distension.     Palpations: Abdomen is soft.     Tenderness: There is no abdominal tenderness.     Comments: 2cm knot/ palpable mass like area but not able to reduce, cannot feel any obvious fascial edges, no definitive hernia noted   Musculoskeletal:        General: Normal range of motion.     Cervical back: Normal range of motion.  Skin:    General: Skin is warm.  Neurological:     General: No focal deficit present.     Mental Status: She is alert and oriented to person, place, and time.  Psychiatric:  Mood and Affect: Mood normal.        Behavior: Behavior normal.        Thought Content: Thought content normal.    Results: None   Assessment & Plan:  Krista Wallace is a 60 y.o. female with a knot in the supraumbilical region that is not definitive for a hernia. This could just be a lipoma. I cannot get this to reduce.  Will get an ultrasound to assess the area. Will call with results and discuss next steps.  Avoid lifting for now given risk of hernia.     All questions were answered to the satisfaction of the patient.     [name] 08/05/2021, 9:20 AM

## 2021-08-01 NOTE — Patient Instructions (Addendum)
Will get an ultrasound to assess the area. Will call with results and discuss next steps.  Avoid lifting for now given risk of hernia.

## 2021-08-05 ENCOUNTER — Encounter: Payer: Self-pay | Admitting: General Surgery

## 2021-08-14 ENCOUNTER — Ambulatory Visit (HOSPITAL_COMMUNITY)
Admission: RE | Admit: 2021-08-14 | Discharge: 2021-08-14 | Disposition: A | Discharge status: Home / Self Care | Payer: BC Managed Care – PPO | Source: Ambulatory Visit | Attending: General Surgery | Admitting: General Surgery

## 2021-08-14 ENCOUNTER — Other Ambulatory Visit: Payer: Self-pay

## 2021-08-14 DIAGNOSIS — R1033 Periumbilical pain: Secondary | ICD-10-CM | POA: Diagnosis not present

## 2021-08-16 ENCOUNTER — Telehealth (INDEPENDENT_AMBULATORY_CARE_PROVIDER_SITE_OTHER): Payer: BC Managed Care – PPO | Admitting: General Surgery

## 2021-08-16 DIAGNOSIS — K439 Ventral hernia without obstruction or gangrene: Secondary | ICD-10-CM | POA: Insufficient documentation

## 2021-08-16 NOTE — Telephone Encounter (Signed)
[facility]  Notified of supraumbilical hernia, wants to get scheduled for surgery. Office will call and get scheduled.   CLINICAL DATA:  Periumbilical pain.   EXAM: ULTRASOUND ABDOMEN LIMITED   COMPARISON:  Chest radiograph, 06/05/2017.   FINDINGS: Focused ultrasound of the abdomen, limited to the subcutaneous tissues above the umbilicus.   Images acquired demonstrating a fascial boundary with approximately 1.2 cm defect and circumscribed ovoid heterogeneously hypoechoic "mushroom" lesion measuring up to 3.7 cm in greatest axial dimension. See key image.   IMPRESSION: 1.2 cm fascial defect with 3.7 cm mushrooming lesion, at the area of palpable concern on the abdomen.   Findings favored consistent with fat-containing supraumbilical hernia.   Roanna Banning, MD   [facility]   [facility]     Electronically Signed   By: Roanna Banning M.D.   On: 08/15/2021 08:35

## 2021-08-22 ENCOUNTER — Ambulatory Visit
Admission: EM | Admit: 2021-08-22 | Discharge: 2021-08-22 | Disposition: A | Discharge status: Home / Self Care | Payer: BC Managed Care – PPO | Attending: Physician Assistant | Admitting: Physician Assistant

## 2021-08-22 ENCOUNTER — Other Ambulatory Visit: Payer: Self-pay

## 2021-08-22 ENCOUNTER — Encounter: Payer: Self-pay | Admitting: Emergency Medicine

## 2021-08-22 DIAGNOSIS — B349 Viral infection, unspecified: Secondary | ICD-10-CM | POA: Diagnosis not present

## 2021-08-22 NOTE — ED Triage Notes (Signed)
Patient c/o productive cough and nasal congestion x 1 week.   Patient endorses chills at night.   Patient endorses chest congestion.   Patient endorses fatigue.   Patient endorses SOB during coughing.   Patient has used Mucinex DM with some relief of congestion.

## 2021-08-22 NOTE — Discharge Instructions (Signed)
Return if any problems.  Your covid and influenza are pending

## 2021-08-23 LAB — COVID-19, FLU A+B NAA
Influenza A, NAA: NOT DETECTED
Influenza B, NAA: NOT DETECTED
SARS-CoV-2, NAA: NOT DETECTED

## 2021-08-23 NOTE — ED Provider Notes (Signed)
[facility]    CSN: [id number] Arrival date & time: 08/22/21  1046      History   Chief Complaint Chief Complaint  Patient presents with   Cough   Nasal Congestion    HPI Krista Wallace is a 60 y.o. female.   The history is provided by the patient. No language interpreter was used.  Cough Cough characteristics:  Non-productive Sputum characteristics:  Nondescript Severity:  Moderate Onset quality:  Gradual Timing:  Constant Progression:  Worsening Chronicity:  New Smoker: no   Context: upper respiratory infection   Relieved by:  Nothing Worsened by:  Nothing Ineffective treatments:  None tried Associated symptoms: no fever   Risk factors: no recent infection    History reviewed. No pertinent past medical history.  Patient Active Problem List   Diagnosis Date Noted   Supraumbilical hernia 08/16/2021   Periumbilical abdominal pain 08/01/2021    History reviewed. No pertinent surgical history.  OB History   No obstetric history on file.      Home Medications    Prior to Admission medications   Medication Sig Start Date End Date Taking? Authorizing Provider  cyclobenzaprine (FLEXERIL) 10 MG tablet Take 1 tablet (10 mg total) by mouth at bedtime. 07/24/20   Wurst, [place], PA-C  ibuprofen (ADVIL,MOTRIN) 200 MG tablet Take 800 mg by mouth every 6 (six) hours as needed.    [provider]  Multiple Vitamins-Minerals (HAIR SKIN AND NAILS FORMULA PO) Take 1 tablet by mouth daily.    [provider]  ondansetron (ZOFRAN ODT) 4 MG disintegrating tablet Take 1 tablet (4 mg total) by mouth every 8 (eight) hours as needed for nausea or vomiting. 06/05/17   Samuel Jester, DO    Family History History reviewed. No pertinent family history.  Social History Social History   Tobacco Use   Smoking status: Every Day    Packs/day: 0.50    Types: Cigarettes   Smokeless tobacco: Never  Substance Use Topics   Alcohol use: No    Drug use: Never     Allergies   Other   Review of Systems Review of Systems  Constitutional:  Negative for fever.  Respiratory:  Positive for cough.   All other systems reviewed and are negative.   Physical Exam Triage Vital Signs ED Triage Vitals  Enc Vitals Group     BP 08/22/21 1258 123/83     Pulse Rate 08/22/21 1258 69     Resp 08/22/21 1258 18     Temp 08/22/21 1258 98 F (36.7 C)     Temp Source 08/22/21 1258 Oral     SpO2 08/22/21 1258 94 %     Weight --      Height --      Head Circumference --      Peak Flow --      Pain Score 08/22/21 1259 0     Pain Loc --      Pain Edu? --      Excl. in GC? --    No data found.  Updated Vital Signs BP 123/83 (BP Location: Right Arm)   Pulse 69   Temp 98 F (36.7 C) (Oral)   Resp 18   SpO2 94%   Visual Acuity Right Eye Distance:   Left Eye Distance:   Bilateral Distance:    Right Eye Near:   Left Eye Near:    Bilateral Near:     Physical Exam Vitals reviewed.  Constitutional:  Appearance: Normal appearance.  HENT:     Right Ear: Tympanic membrane normal.     Left Ear: Tympanic membrane normal.     Mouth/Throat:     Mouth: Mucous membranes are moist.  Eyes:     Pupils: Pupils are equal, round, and reactive to light.  Cardiovascular:     Rate and Rhythm: Normal rate.  Pulmonary:     Effort: Pulmonary effort is normal.  Abdominal:     General: Abdomen is flat.  Musculoskeletal:     Cervical back: Normal range of motion.  Skin:    General: Skin is warm.  Neurological:     General: No focal deficit present.     Mental Status: She is alert.  Psychiatric:        Mood and Affect: Mood normal.     UC Treatments / Results  Labs (all labs ordered are listed, but only abnormal results are displayed) Labs Reviewed  COVID-19, FLU A+B NAA    EKG   Radiology No results found.  Procedures Procedures (including critical care time)  Medications Ordered in UC Medications - No data to  display  Initial Impression / Assessment and Plan / UC Course  I have reviewed the triage vital signs and the nursing notes.  Pertinent labs & imaging results that were available during my care of the patient were reviewed by me and considered in my medical decision making (see chart for details).     MDM:  covid and flu pending Final Clinical Impressions(s) / UC Diagnoses   Final diagnoses:  Viral illness     Discharge Instructions      Return if any problems.  Your covid and influenza are pending    ED Prescriptions   None    An After Visit Summary was printed and given to the patient.  PDMP not reviewed this encounter.   Elson Areas, [place] 08/23/21 [id number]

## 2021-10-11 ENCOUNTER — Encounter (HOSPITAL_COMMUNITY): Payer: Self-pay | Admitting: Anesthesiology

## 2021-10-18 NOTE — Patient Instructions (Signed)
Krista Wallace  10/18/2021     @PREFPERIOPPHARMACY @   Your procedure is scheduled on  10/25/2021.   Report to [facility] at  1100  A.M.   Call this number if you have problems the morning of surgery:  [phone]   Remember:  Do not eat or drink after midnight.      Take these medicines the morning of surgery with A SIP OF WATER                         flexeril(if needed), zofran (if needed).     Do not wear jewelry, make-up or nail polish.  Do not wear lotions, powders, or perfumes, or deodorant.  Do not shave 48 hours prior to surgery.  Men may shave face and neck.  Do not bring valuables to the hospital.  [facility] is not responsible for any belongings or valuables.  Contacts, dentures or bridgework may not be worn into surgery.  Leave your suitcase in the car.  After surgery it may be brought to your room.  For patients admitted to the hospital, discharge time will be determined by your treatment team.  Patients discharged the day of surgery will not be allowed to drive home and must have someone with them for 24 hours.    Special instructions:   DO NOT smoke tobacco or vape for 24 hours before your procedure.  Please read over the following fact sheets that you were given. Coughing and Deep Breathing, Surgical Site Infection Prevention, Anesthesia Post-op Instructions, and Care and Recovery After Surgery      Open Hernia Repair, Adult, Care After What can I expect after the procedure? After the procedure, it is common to have: Mild discomfort. Slight bruising. Mild swelling. Pain in the belly (abdomen). A small amount of blood from the cut from surgery (incision). Follow these instructions at home: Your doctor may give you more specific instructions. If you have problems, call your doctor. Medicines Take over-the-counter and prescription medicines only as told by your doctor. If told, take steps to prevent problems with pooping  (constipation). You may need to: Drink enough fluid to keep your pee (urine) pale yellow. Take medicines. You will be told what medicines to take. Eat foods that are high in fiber. These include beans, whole grains, and fresh fruits and vegetables. Limit foods that are high in fat and sugar. These include fried or sweet foods. Ask your doctor if you should avoid driving or using machines while you are taking your medicine. Incision care  Follow instructions from your doctor about how to take care of your incision. Make sure you: Wash your hands with soap and water for at least 20 seconds before and after you change your bandage (dressing). If you cannot use soap and water, use hand sanitizer. Change your bandage. Leave stitches or skin glue in place for at least 2 weeks. Leave tape strips alone unless you are told to take them off. You may trim the edges of the tape strips if they curl up. Check your incision every day for signs of infection. Check for: More redness, swelling, or pain. More fluid or blood. Warmth. Pus or a bad smell. Wear loose, soft clothing while your incision heals. Activity  Rest as told by your doctor. Do not lift anything that is heavier than 10 lb (4.5 kg), or the limit that you are told. Do not  play contact sports until your doctor says that this is safe. If you were given a sedative during your procedure, do not drive or use machines until your doctor says that it is safe. A sedative is a medicine that helps you relax. Return to your normal activities when your doctor says that it is safe. General instructions Do not take baths, swim, or use a hot tub. Ask your doctor about taking showers or sponge baths. Hold a pillow over your belly when you cough or sneeze. This helps with pain. Do not smoke or use any products that contain nicotine or tobacco. If you need help quitting, ask your doctor. Keep all follow-up visits. Contact a doctor if: You have any of these  signs of infection in or around your incision: More redness, swelling, or pain. More fluid or blood. Warmth. Pus. A bad smell. You have a fever or chills. You have blood in your poop (stool). You have not pooped (had a bowel movement) in 2-3 days. Medicine does not help your pain. Get help right away if: You have chest pain, or you are short of breath. You feel faint or light-headed. You have very bad pain. You vomit and your pain is worse. You have pain, swelling, or redness in a leg. These symptoms may be an emergency. Get help right away. Call your local emergency services (911 in the [place] Do not wait to see if the symptoms will go away. Do not drive yourself to the hospital. Summary After this procedure, it is common to have mild discomfort, slight bruising, and mild swelling. Follow instructions from your doctor about how to take care of your cut from surgery (incision). Check every day for signs of infection. Do not lift heavy objects or play contact sports until your doctor says it is safe. Return to your normal activities as told by your doctor. This information is not intended to replace advice given to you by your health care provider. Make sure you discuss any questions you have with your health care provider. Document Revised: 05/28/2020 Document Reviewed: 05/28/2020 Elsevier Patient Education  2022 [employer] General Anesthesia, Adult, Care After This sheet gives you information about how to care for yourself after your procedure. Your health care provider may also give you more specific instructions. If you have problems or questions, contact your health care provider. What can I expect after the procedure? After the procedure, the following side effects are common: Pain or discomfort at the IV site. Nausea. Vomiting. Sore throat. Trouble concentrating. Feeling cold or chills. Feeling weak or tired. Sleepiness and fatigue. Soreness and body aches. These side  effects can affect parts of the body that were not involved in surgery. Follow these instructions at home: For the time period you were told by your health care provider:  Rest. Do not participate in activities where you could fall or become injured. Do not drive or use machinery. Do not drink alcohol. Do not take sleeping pills or medicines that cause drowsiness. Do not make important decisions or sign legal documents. Do not take care of children on your own. Eating and drinking Follow any instructions from your health care provider about eating or drinking restrictions. When you feel hungry, start by eating small amounts of foods that are soft and easy to digest (bland), such as toast. Gradually return to your regular diet. Drink enough fluid to keep your urine pale yellow. If you vomit, rehydrate by drinking water, juice, or clear broth. General instructions If  you have sleep apnea, surgery and certain medicines can increase your risk for breathing problems. Follow instructions from your health care provider about wearing your sleep device: Anytime you are sleeping, including during daytime naps. While taking prescription pain medicines, sleeping medicines, or medicines that make you drowsy. Have a responsible adult stay with you for the time you are told. It is important to have someone help care for you until you are awake and alert. Return to your normal activities as told by your health care provider. Ask your health care provider what activities are safe for you. Take over-the-counter and prescription medicines only as told by your health care provider. If you smoke, do not smoke without supervision. Keep all follow-up visits as told by your health care provider. This is important. Contact a health care provider if: You have nausea or vomiting that does not get better with medicine. You cannot eat or drink without vomiting. You have pain that does not get better with  medicine. You are unable to pass urine. You develop a skin rash. You have a fever. You have redness around your IV site that gets worse. Get help right away if: You have difficulty breathing. You have chest pain. You have blood in your urine or stool, or you vomit blood. Summary After the procedure, it is common to have a sore throat or nausea. It is also common to feel tired. Have a responsible adult stay with you for the time you are told. It is important to have someone help care for you until you are awake and alert. When you feel hungry, start by eating small amounts of foods that are soft and easy to digest (bland), such as toast. Gradually return to your regular diet. Drink enough fluid to keep your urine pale yellow. Return to your normal activities as told by your health care provider. Ask your health care provider what activities are safe for you. This information is not intended to replace advice given to you by your health care provider. Make sure you discuss any questions you have with your health care provider. Document Revised: 06/28/2020 Document Reviewed: 01/26/2020 Elsevier Patient Education  2022 [employer] Inc. How to Use Chlorhexidine for Bathing Chlorhexidine gluconate (CHG) is a germ-killing (antiseptic) solution that is used to clean the skin. It can get rid of the bacteria that normally live on the skin and can keep them away for about 24 hours. To clean your skin with CHG, you may be given: A CHG solution to use in the shower or as part of a sponge bath. A prepackaged cloth that contains CHG. Cleaning your skin with CHG may help lower the risk for infection: While you are staying in the intensive care unit of the hospital. If you have a vascular access, such as a central line, to provide short-term or long-term access to your veins. If you have a catheter to drain urine from your bladder. If you are on a ventilator. A ventilator is a machine that helps you breathe  by moving air in and out of your lungs. After surgery. What are the risks? Risks of using CHG include: A skin reaction. Hearing loss, if CHG gets in your ears and you have a perforated eardrum. Eye injury, if CHG gets in your eyes and is not rinsed out. The CHG product catching fire. Make sure that you avoid smoking and flames after applying CHG to your skin. Do not use CHG: If you have a chlorhexidine allergy or have previously  reacted to chlorhexidine. On babies younger than 75 months of age. How to use CHG solution Use CHG only as told by your health care provider, and follow the instructions on the label. Use the full amount of CHG as directed. Usually, this is one bottle. During a shower Follow these steps when using CHG solution during a shower (unless your health care provider gives you different instructions): Start the shower. Use your normal soap and shampoo to wash your face and hair. Turn off the shower or move out of the shower stream. Pour the CHG onto a clean washcloth. Do not use any type of brush or rough-edged sponge. Starting at your neck, lather your body down to your toes. Make sure you follow these instructions: If you will be having surgery, pay special attention to the part of your body where you will be having surgery. Scrub this area for at least 1 minute. Do not use CHG on your head or face. If the solution gets into your ears or eyes, rinse them well with water. Avoid your genital area. Avoid any areas of skin that have broken skin, cuts, or scrapes. Scrub your back and under your arms. Make sure to wash skin folds. Let the lather sit on your skin for 1-2 minutes or as long as told by your health care provider. Thoroughly rinse your entire body in the shower. Make sure that all body creases and crevices are rinsed well. Dry off with a clean towel. Do not put any substances on your body afterward--such as powder, lotion, or perfume--unless you are told to do so  by your health care provider. Only use lotions that are recommended by the manufacturer. Put on clean clothes or pajamas. If it is the night before your surgery, sleep in clean sheets.  During a sponge bath Follow these steps when using CHG solution during a sponge bath (unless your health care provider gives you different instructions): Use your normal soap and shampoo to wash your face and hair. Pour the CHG onto a clean washcloth. Starting at your neck, lather your body down to your toes. Make sure you follow these instructions: If you will be having surgery, pay special attention to the part of your body where you will be having surgery. Scrub this area for at least 1 minute. Do not use CHG on your head or face. If the solution gets into your ears or eyes, rinse them well with water. Avoid your genital area. Avoid any areas of skin that have broken skin, cuts, or scrapes. Scrub your back and under your arms. Make sure to wash skin folds. Let the lather sit on your skin for 1-2 minutes or as long as told by your health care provider. Using a different clean, wet washcloth, thoroughly rinse your entire body. Make sure that all body creases and crevices are rinsed well. Dry off with a clean towel. Do not put any substances on your body afterward--such as powder, lotion, or perfume--unless you are told to do so by your health care provider. Only use lotions that are recommended by the manufacturer. Put on clean clothes or pajamas. If it is the night before your surgery, sleep in clean sheets. How to use CHG prepackaged cloths Only use CHG cloths as told by your health care provider, and follow the instructions on the label. Use the CHG cloth on clean, dry skin. Do not use the CHG cloth on your head or face unless your health care provider tells you to.  When washing with the CHG cloth: Avoid your genital area. Avoid any areas of skin that have broken skin, cuts, or scrapes. Before  surgery Follow these steps when using a CHG cloth to clean before surgery (unless your health care provider gives you different instructions): Using the CHG cloth, vigorously scrub the part of your body where you will be having surgery. Scrub using a back-and-forth motion for 3 minutes. The area on your body should be completely wet with CHG when you are done scrubbing. Do not rinse. Discard the cloth and let the area air-dry. Do not put any substances on the area afterward, such as powder, lotion, or perfume. Put on clean clothes or pajamas. If it is the night before your surgery, sleep in clean sheets.  For general bathing Follow these steps when using CHG cloths for general bathing (unless your health care provider gives you different instructions). Use a separate CHG cloth for each area of your body. Make sure you wash between any folds of skin and between your fingers and toes. Wash your body in the following order, switching to a new cloth after each step: The front of your neck, shoulders, and chest. Both of your arms, under your arms, and your hands. Your stomach and groin area, avoiding the genitals. Your right leg and foot. Your left leg and foot. The back of your neck, your back, and your buttocks. Do not rinse. Discard the cloth and let the area air-dry. Do not put any substances on your body afterward--such as powder, lotion, or perfume--unless you are told to do so by your health care provider. Only use lotions that are recommended by the manufacturer. Put on clean clothes or pajamas. Contact a health care provider if: Your skin gets irritated after scrubbing. You have questions about using your solution or cloth. You swallow any chlorhexidine. Call your local poison control center ([phone] in the [place] Get help right away if: Your eyes itch badly, or they become very red or swollen. Your skin itches badly and is red or swollen. Your hearing changes. You have trouble  seeing. You have swelling or tingling in your mouth or throat. You have trouble breathing. These symptoms may represent a serious problem that is an emergency. Do not wait to see if the symptoms will go away. Get medical help right away. Call your local emergency services (911 in the [place] Do not drive yourself to the hospital. Summary Chlorhexidine gluconate (CHG) is a germ-killing (antiseptic) solution that is used to clean the skin. Cleaning your skin with CHG may help to lower your risk for infection. You may be given CHG to use for bathing. It may be in a bottle or in a prepackaged cloth to use on your skin. Carefully follow your health care provider's instructions and the instructions on the product label. Do not use CHG if you have a chlorhexidine allergy. Contact your health care provider if your skin gets irritated after scrubbing. This information is not intended to replace advice given to you by your health care provider. Make sure you discuss any questions you have with your health care provider. Document Revised: 12/24/2020 Document Reviewed: 12/24/2020 Elsevier Patient Education  2022 [employer].

## 2021-10-23 ENCOUNTER — Encounter (HOSPITAL_COMMUNITY)
Admission: RE | Admit: 2021-10-23 | Discharge: 2021-10-23 | Disposition: A | Discharge status: Home / Self Care | Payer: BC Managed Care – PPO | Source: Ambulatory Visit | Attending: General Surgery | Admitting: General Surgery

## 2021-10-23 ENCOUNTER — Encounter (HOSPITAL_COMMUNITY): Payer: Self-pay

## 2021-10-24 NOTE — Pre-Procedure Instructions (Signed)
Patient was a no show for her pre-op on 12/28. I left her multiple messages for her to call back and ask her to come today at [phone] or [phone] today for her pre-op if she was still having surgery on 12/30. She did not show and I did not receive a phone call. I called Lenna Sciara at Dr Henreitta Leber office and she was going to attempt to call patient again. I also called her sister and left a message with her.

## 2021-10-25 ENCOUNTER — Ambulatory Visit (HOSPITAL_COMMUNITY): Admission: RE | Admit: 2021-10-25 | Payer: BC Managed Care – PPO | Source: Ambulatory Visit | Admitting: General Surgery

## 2021-10-25 ENCOUNTER — Encounter (HOSPITAL_COMMUNITY): Admission: RE | Payer: Self-pay | Source: Ambulatory Visit

## 2021-10-25 SURGERY — REPAIR, HERNIA, UMBILICAL, ADULT
Anesthesia: General

## 2023-02-15 IMAGING — US US ABDOMEN LIMITED
1 series · 14 of 19 positions shown · non-contrast
Comparison: Chest radiograph, 06/05/2017.

CLINICAL DATA: Periumbilical pain.

EXAM:
ULTRASOUND ABDOMEN LIMITED

[Series 1: us abdomen limited · 0.10mm/px · 19 acquisitions, 14 frames shown]
[im 1/19]
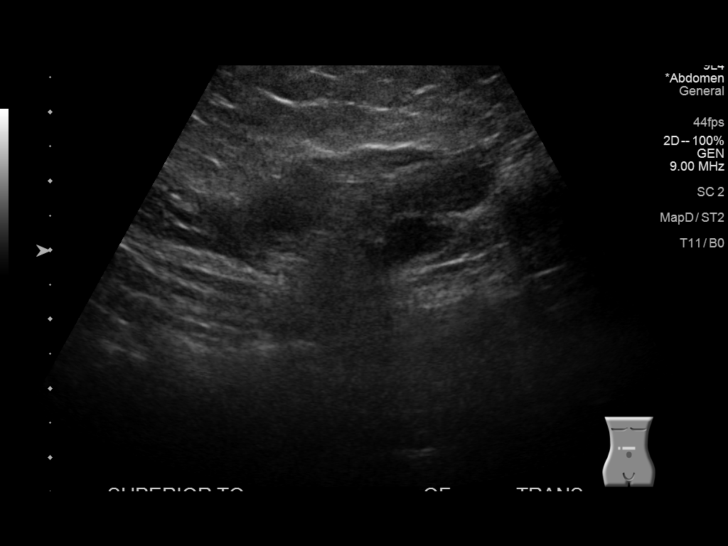
[im 3/19]
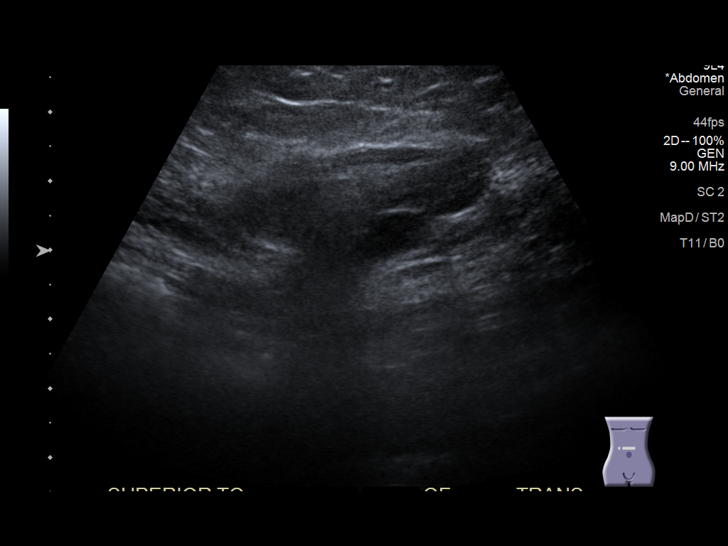
[im 4/19]
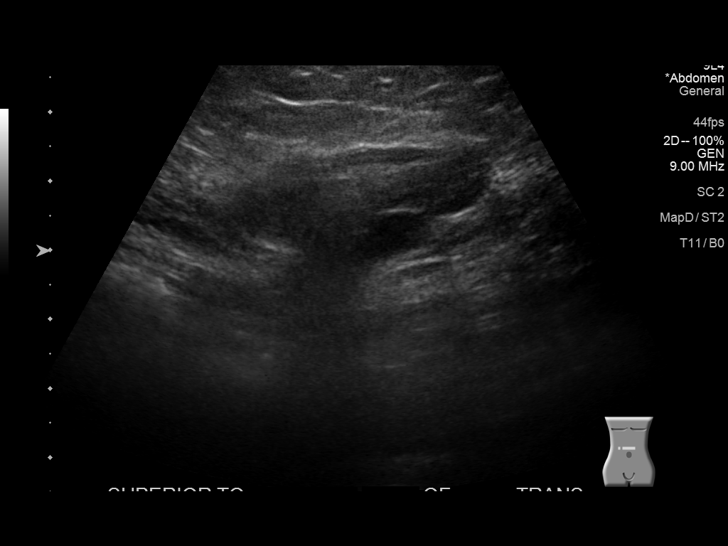
[im 5/19]
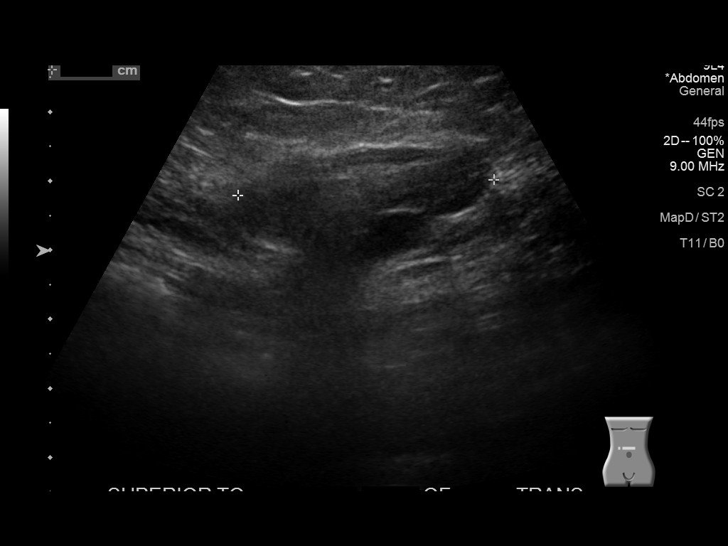
[im 7/19]
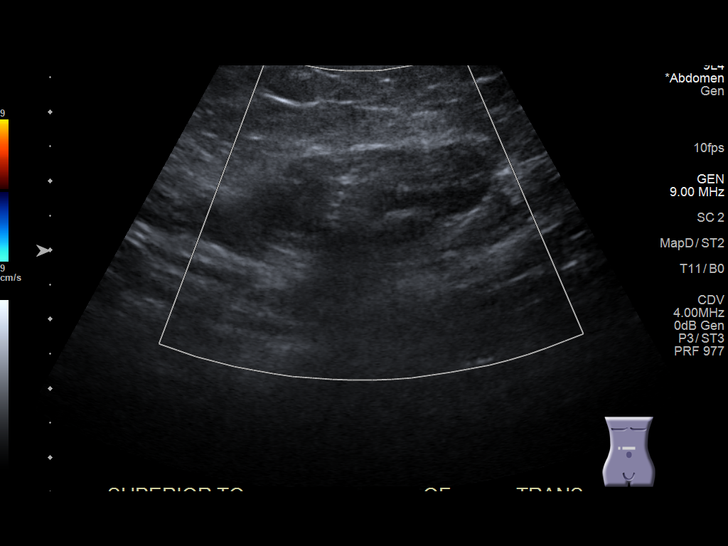
[im 8/19]
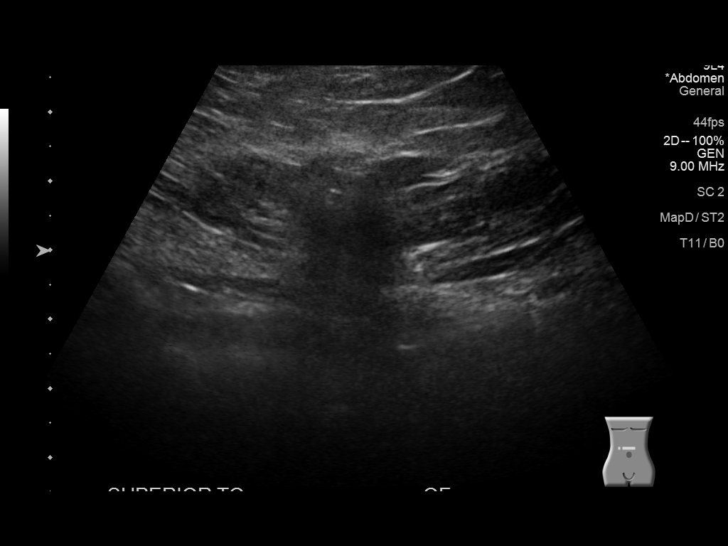
[im 9/19]
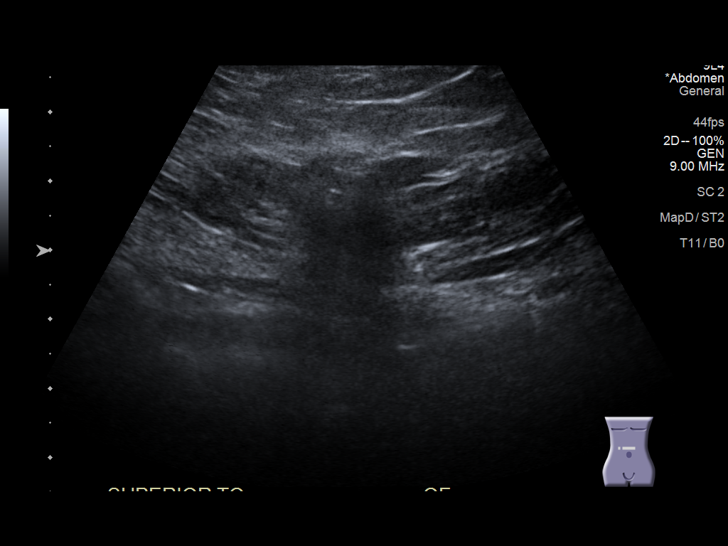
[im 11/19]
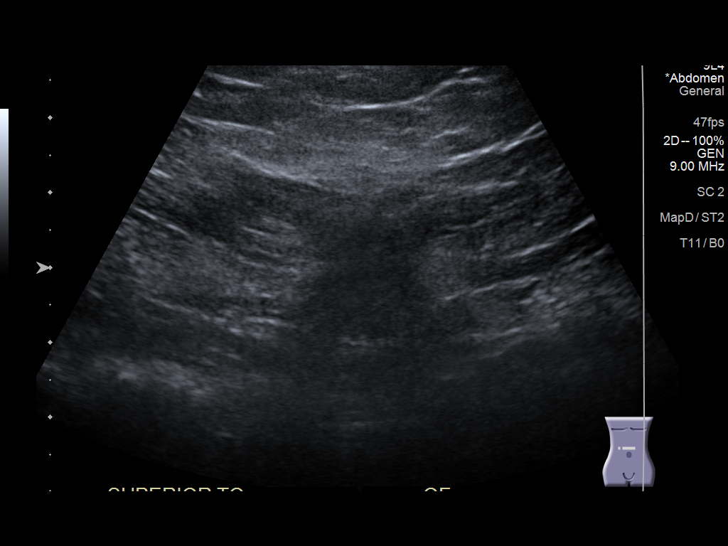
[im 12/19]
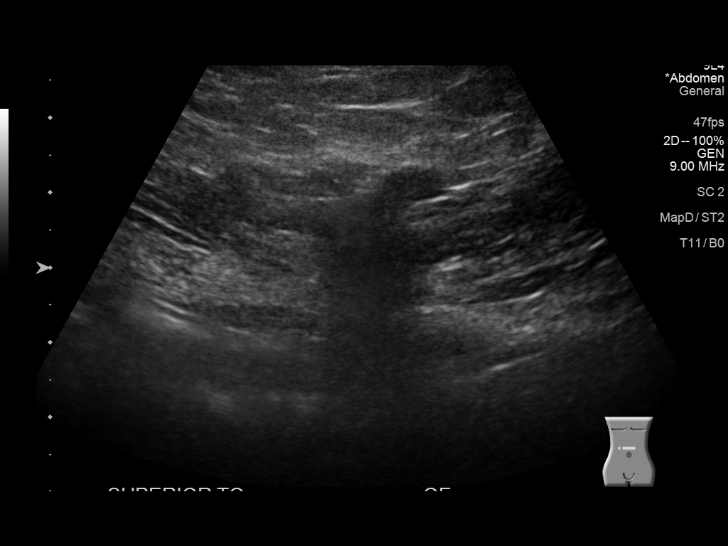
[im 13/19]
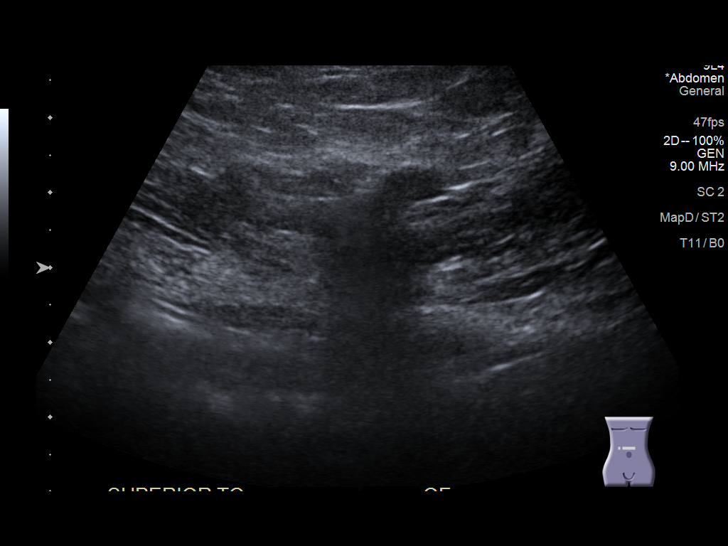
[im 15/19]
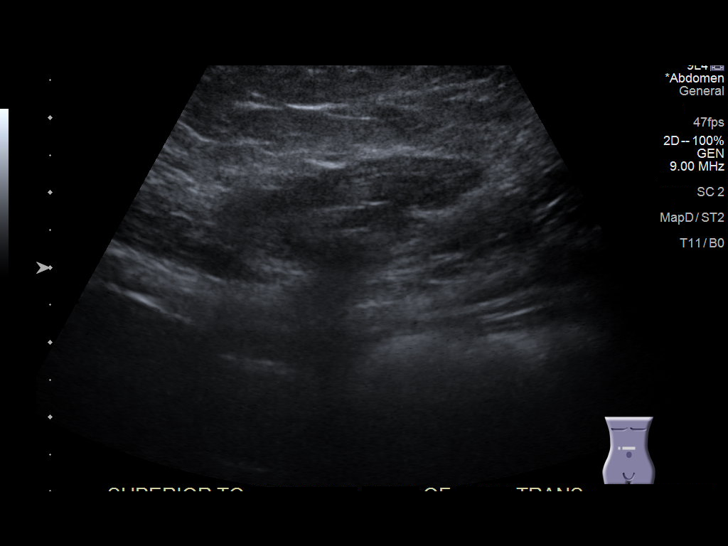
[im 16/19]
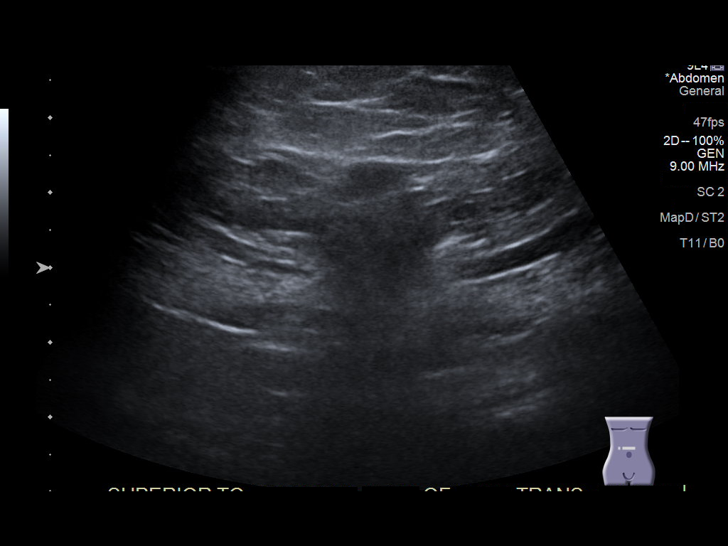
[im 17/19]
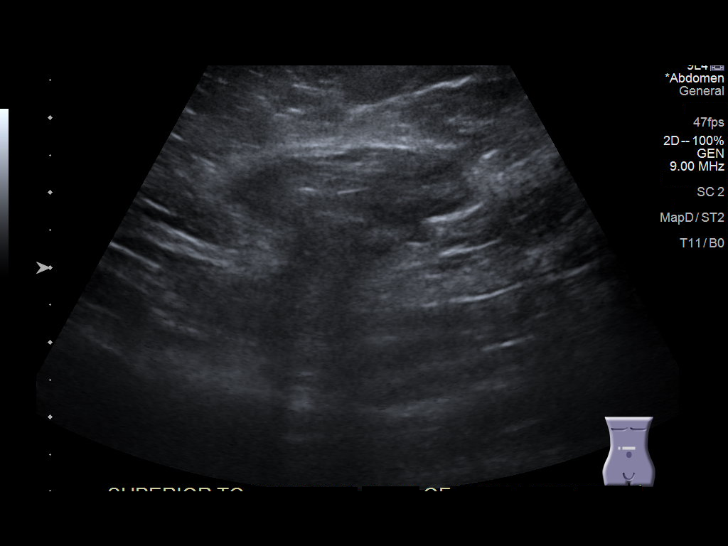
[im 19/19]
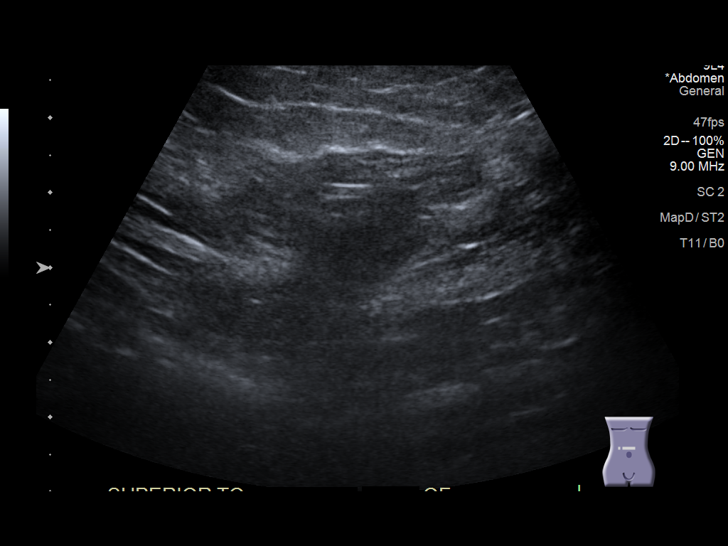

[14 of 19 positions shown; findings below may reference images not displayed]

FINDINGS: Focused ultrasound of the abdomen, limited to the subcutaneous
tissues above the umbilicus.

Images acquired demonstrating a fascial boundary with approximately
1.2 cm defect and circumscribed ovoid heterogeneously hypoechoic
"mushroom" lesion measuring up to 3.7 cm in greatest axial
dimension. See key image.
IMPRESSION: 1.2 cm fascial defect with 3.7 cm mushrooming lesion, at the area of
palpable concern on the abdomen.

Findings favored consistent with fat-containing supraumbilical
hernia.

## 2024-01-13 ENCOUNTER — Encounter (INDEPENDENT_AMBULATORY_CARE_PROVIDER_SITE_OTHER): Payer: Self-pay | Admitting: *Deleted

## 2024-04-13 ENCOUNTER — Other Ambulatory Visit (HOSPITAL_COMMUNITY): Payer: Self-pay | Admitting: Internal Medicine

## 2024-04-13 DIAGNOSIS — Z87891 Personal history of nicotine dependence: Secondary | ICD-10-CM

## 2024-05-01 ENCOUNTER — Encounter (HOSPITAL_COMMUNITY): Payer: Self-pay

## 2024-05-01 ENCOUNTER — Ambulatory Visit (HOSPITAL_COMMUNITY): Admission: RE | Admit: 2024-05-01 | Source: Ambulatory Visit

## 2024-05-09 ENCOUNTER — Ambulatory Visit (HOSPITAL_COMMUNITY)
Admission: RE | Admit: 2024-05-09 | Discharge: 2024-05-09 | Disposition: A | Discharge status: Home / Self Care | Source: Ambulatory Visit | Attending: Internal Medicine | Admitting: Internal Medicine

## 2024-05-09 DIAGNOSIS — Z87891 Personal history of nicotine dependence: Secondary | ICD-10-CM | POA: Insufficient documentation

## 2024-07-20 ENCOUNTER — Encounter (INDEPENDENT_AMBULATORY_CARE_PROVIDER_SITE_OTHER): Payer: Self-pay | Admitting: *Deleted

## 2024-09-11 ENCOUNTER — Other Ambulatory Visit: Payer: Self-pay

## 2024-09-11 ENCOUNTER — Emergency Department (HOSPITAL_COMMUNITY)

## 2024-09-11 ENCOUNTER — Emergency Department (HOSPITAL_COMMUNITY)
Admission: EM | Admit: 2024-09-11 | Discharge: 2024-09-11 | Disposition: A | Discharge status: Home / Self Care | Attending: Emergency Medicine | Admitting: Emergency Medicine

## 2024-09-11 ENCOUNTER — Encounter (HOSPITAL_COMMUNITY): Payer: Self-pay | Admitting: *Deleted

## 2024-09-11 DIAGNOSIS — I10 Essential (primary) hypertension: Secondary | ICD-10-CM | POA: Diagnosis not present

## 2024-09-11 DIAGNOSIS — M79605 Pain in left leg: Secondary | ICD-10-CM | POA: Diagnosis present

## 2024-09-11 DIAGNOSIS — I8002 Phlebitis and thrombophlebitis of superficial vessels of left lower extremity: Secondary | ICD-10-CM | POA: Insufficient documentation

## 2024-09-11 HISTORY — DX: Essential (primary) hypertension: I10

## 2024-09-11 MED ORDER — RIVAROXABAN 15 MG PO TABS
15.0000 mg | ORAL_TABLET | Freq: Once | ORAL | Status: AC
  Administered 2024-09-11: 15 mg via ORAL
  Filled 2024-09-11: qty 1

## 2024-09-11 MED ORDER — RIVAROXABAN (XARELTO) VTE STARTER PACK (15 & 20 MG): 1.0000 | ORAL_TABLET | ORAL | 0 refills | Status: AC

## 2024-09-11 NOTE — ED Notes (Signed)
 Pt/family received d/c paperwork at this time. After going over the paperwork any questions, comments, or concerns were answered to the best of this nurse's knowledge. The pt/family verbally acknowledged the teachings/instructions.

## 2024-09-11 NOTE — Discharge Instructions (Addendum)
 You are seen in the emergency department today for left leg pain and swelling, you have a superficial blood clot in your greater saphenous vein.  We are going to treat you with blood thinners, use over-the-counter Tylenol  and warm compresses to help with your discomfort as well.  As discussed these can cause increased risk of heavy bleeding including life-threatening bleeding, if you have an injury make sure you get evaluated if you have heavy bleeding, or bleeding that does not stop with pressure in the ER anyway.  If you develop chest pain, shortness of breath or other worsening symptoms come back to the ER.  Follow-up with your DVT clinic.

## 2024-09-11 NOTE — ED Triage Notes (Signed)
 Pt c/o left leg pain with redness and swelling x 5 days ago

## 2024-09-11 NOTE — ED Provider Notes (Signed)
 [place] EMERGENCY DEPARTMENT AT [facility] Provider Note   CSN: [id number] Arrival date & time: 09/11/24  9058     Patient presents with: Leg Pain   Krista Wallace is a 63 y.o. female.  She has history of hypertension and varicose veins.  Presents the ER complaining of redness and swelling to the left medial thigh over an area of one of the varicose veins and is worried about blood clot.  She has fever or chills, no nausea or vomiting, denies injury to the area.  He has no history of DVT, no history of cancer.  She states she follows really with her PCP and has been taking fish oil supplement and calcium supplements but is on no other medications.  States showed ibuprofen for pain last night with good relief.    Leg Pain      Prior to Admission medications   Medication Sig Start Date End Date Taking? Authorizing Provider  RIVAROXABAN TAD) VTE STARTER PACK (15 & 20 MG) Take 1 Package by mouth as directed. Follow package directions: Take one 15mg  tablet by mouth twice a day. On day 22, switch to one 20mg  tablet once a day. Take with food. 09/11/24  Yes Lesli Issa A, PA-C  cyclobenzaprine  (FLEXERIL ) 10 MG tablet Take 1 tablet (10 mg total) by mouth at bedtime. 07/24/20   Wurst, Brittany, PA-C  ibuprofen (ADVIL,MOTRIN) 200 MG tablet Take 800 mg by mouth every 6 (six) hours as needed.    [provider]  Multiple Vitamins-Minerals (HAIR SKIN AND NAILS FORMULA PO) Take 1 tablet by mouth daily.    [provider]  ondansetron  (ZOFRAN  ODT) 4 MG disintegrating tablet Take 1 tablet (4 mg total) by mouth every 8 (eight) hours as needed for nausea or vomiting. 06/05/17   Joyice Sauer, DO    Allergies: Other    Review of Systems  Updated Vital Signs BP 130/69   Pulse 86   Temp 98.3 F (36.8 C)   Resp (!) 22   Ht 5' 2 (1.575 m)   Wt 83 kg   SpO2 96%   BMI 33.47 kg/m   Physical Exam Vitals and nursing note reviewed.  Constitutional:       General: She is not in acute distress.    Appearance: She is well-developed.  HENT:     Head: Normocephalic and atraumatic.     Mouth/Throat:     Mouth: Mucous membranes are moist.  Eyes:     Extraocular Movements: Extraocular movements intact.     Conjunctiva/sclera: Conjunctivae normal.     Pupils: Pupils are equal, round, and reactive to light.  Cardiovascular:     Rate and Rhythm: Normal rate and regular rhythm.     Heart sounds: No murmur heard. Pulmonary:     Effort: Pulmonary effort is normal. No respiratory distress.     Breath sounds: Normal breath sounds.  Abdominal:     Palpations: Abdomen is soft.     Tenderness: There is no abdominal tenderness.  Musculoskeletal:        General: No swelling.     Cervical back: Neck supple.     Comments: Tenderness to left medial thigh with overlying erythema and swelling approximately 8 cm in diameter  Skin:    General: Skin is warm and dry.     Capillary Refill: Capillary refill takes less than 2 seconds.  Neurological:     General: No focal deficit present.     Mental Status: She  is alert and oriented to person, place, and time.  Psychiatric:        Mood and Affect: Mood normal.     (all labs ordered are listed, but only abnormal results are displayed) Labs Reviewed - No data to display  EKG: None  Radiology: US  Venous Img Lower  Left (DVT Study) Result Date: 09/11/2024 EXAM: ULTRASOUND DUPLEX OF THE LEFT LOWER EXTREMITY VEINS TECHNIQUE: Duplex ultrasound using B-mode/gray scaled imaging and Doppler spectral analysis and color flow was obtained of the deep venous structures of the left lower extremity. COMPARISON: None available. CLINICAL HISTORY: swelling, pain FINDINGS: The common femoral vein, femoral vein, popliteal vein, and posterior tibial vein of the left lower extremity demonstrate normal compressibility with normal color flow and spectral analysis. There is superficial thrombophlebitis involving the greater  saphenous vein in the distal thigh. IMPRESSION: 1. No evidence of DVT in the left lower extremity. 2. Superficial thrombophlebitis involving the greater saphenous vein in the distal thigh. Electronically signed by: Evalene Coho MD 09/11/2024 11:47 AM EST RP Workstation: [id number]     Procedures   Medications Ordered in the ED  Rivaroxaban (XARELTO) tablet 15 mg (has no administration in time range)                                    Medical Decision Making This patient presents to the ED for concern of left thigh redness and swelling, this involves an extensive number of treatment options, and is a complaint that carries with it a high risk of complications and morbidity.  The differential diagnosis includes superficial venous thrombophlebitis, DVT, cellulitis, abscess, other   Co morbidities that complicate the patient evaluation  None   Additional history obtained:  Additional history obtained from EMR External records from outside source obtained and reviewed including previous notes     Imaging Studies ordered:  I ordered imaging studies including US  of the left leg I independently visualized and interpreted imaging which showed greater saphenous venous thrombosis and left distal leg I agree with the radiologist interpretation     Problem List / ED Course / Critical interventions / Medication management  Left leg pain-patient concerning for blood clot-she orts that she has varicose veins in this area.  No chest pain shortness of breath, dizziness or other complaints.  She has very localized tenderness with overlying redness.  Ultrasound confirmed greater saphenous superficial venous thrombosis.  Discussed with patient that since this is in the proximal superficial vein will treat with anticoagulation and refer to DVT clinic to prevent extension into the deep venous stone.  She does not have history of anemia or bleeding disorders, denies any blood in her stool, she  does not have hypertension.  Do not feel there is any need for emergent labs, she is low risk for severe bleeding and she is instructed on risks of severe bleeding with the Xarelto.  She understands risks and agreeable with proceeding with treatment.  Patient given strict return precautions and DVT clinic follow-up.  I have reviewed the patients home medicines and have made adjustments as needed  Risk Prescription drug management.        Final diagnoses:  Thrombophlebitis of superficial veins of left lower extremity    ED Discharge Orders          Ordered    AMB Referral to Deep Vein Thrombosis Clinic        09/11/24 1222  RIVAROXABAN (XARELTO) VTE STARTER PACK (15 & 20 MG)  As directed        09/11/24 1223               Suellen Sherran DELENA DEVONNA 09/11/24 1229    Yolande Lamar BROCKS, MD 09/13/24 1250

## 2024-09-15 ENCOUNTER — Other Ambulatory Visit (HOSPITAL_COMMUNITY): Payer: Self-pay

## 2024-09-15 NOTE — Progress Notes (Addendum)
 DVT Clinic Note  Name: Krista Wallace     MRN: [id number]     DOB: December 03, 1960     Sex: female  PCP: Orpha Yancey LABOR, MD  Today's Visit: Visit Information: Initial Visit  Referred to DVT Clinic by: Emergency Department - Sherran Barks, PA-C Referred to CPP by: Dr. Pearline Reason for referral:  Chief Complaint  Patient presents with   Superficial vein thrombosis   HISTORY OF PRESENT ILLNESS: Krista Wallace is a 63 y.o. female with PMH HTN and varicose veins who presents after diagnosis of superficial vein thrombosis for medication management. She presented to the ED on 09/11/24 with left leg pain, swelling, and redness over an area of one of her varicose veins. Ultrasound showed no evidence of DVT, but she was found to have superficial thrombophlebitis involving the left greater saphenous vein in the distal thigh. She was discharged on Xarelto  and referred to DVT Clinic for follow up.  Today, patient presents to clinic accompanied by her daughter. Reports her symptoms began about 2 weeks ago. Initially she had pain and swelling in her calf which then extended up into her thigh. Now pain, swelling, and redness is localized to her medial thigh. Denies missed doses of Xarelto . Although she had a delay in starting anticoagulation as the pharmacy did not have the starter pack in stock and did not fill the medication for the regular tablets. She started taking Xarelto  on Tuesday, 09/13/24 and used the 1 month free card to fill the medication. She took her first dose on an empty stomach because she did not know it needed to be taken with food. She had nausea so has since started taking it with a meal and now is tolerating the medication well. Denies abnormal bleeding or bruising. Endorses wearing compression stockings, but has been having difficulty with them staying up on her leg as they are too small and she would like to be measured for new compression stockings today. Also has been elevating  her leg. Of note, she is currently taking doxycycline  for an upper respiratory infection. 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 She reports stopping ibuprofen when she started on Xarelto .  Positive Thrombotic Risk Factors: Smoking, Obesity Bleeding Risk Factors: None Present  Negative Thrombotic Risk Factors: Previous VTE, Recent surgery (within 3 months), Recent trauma (within 3 months), Recent admission to hospital with acute illness (within 3 months), Paralysis, paresis, or recent plaster cast immobilization of lower extremity, Central venous catheterization, Pregnancy, Sedentary journey lasting >8 hours within 4 weeks, Bed rest >72 hours within 3 months, Within 6 weeks postpartum, Recent cesarean section (within 3 months), Estrogen therapy, Recent COVID diagnosis (within 3 months), Erythropoiesis-stimulating agent, Testosterone therapy, Active cancer, Non-malignant, chronic inflammatory condition, Known thrombophilic condition, Older age  Rx Insurance Coverage: Commercial Rx Affordability: Xarelto  is $150 for a 30 day supply or $85 for a 15 day supply with her insurance. She can use the copay card to reduce the cost to $10 for a 30 day supply. Rx Assistance Provided: Co-pay card Preferred Pharmacy: Remainder of treatment sent to [facility].  Past Medical History:  Diagnosis Date   Hypertension     No past surgical history on file.  Social History   Socioeconomic History   Marital status: Legally Separated    Spouse name: Not on file   Number of children: Not on file   Years of education: Not on file   Highest education level: Not  on file  Occupational History   Not on file  Tobacco Use   Smoking status: Every Day    Current packs/day: 0.50    Types: Cigarettes   Smokeless tobacco: Never  Vaping Use   Vaping status: Never Used  Substance and Sexual Activity   Alcohol use: No   Drug use: Never   Sexual activity: Not on file  Other Topics Concern    Not on file  Social History Narrative   Not on file   Social Drivers of Health   Financial Resource Strain: Not on file  Food Insecurity: Not on file  Transportation Needs: Not on file  Physical Activity: Not on file  Stress: Not on file  Social Connections: Not on file  Intimate Partner Violence: Not on file    No family history on file.  Allergies as of 09/16/2024 - Review Complete 09/11/2024  Allergen Reaction Noted   Other Anaphylaxis 07/17/2021    Current Outpatient Medications on File Prior to Visit  Medication Sig Dispense Refill   calcium carbonate (OS-CAL - DOSED IN MG OF ELEMENTAL CALCIUM) 1250 (500 Ca) MG tablet Take 1 tablet by mouth.     doxycycline  (VIBRAMYCIN ) 100 MG capsule Take 100 mg by mouth 2 (two) times daily.     Multiple Vitamin (MULTIVITAMIN) tablet Take 1 tablet by mouth daily.     Multiple Vitamins-Minerals (HAIR SKIN AND NAILS FORMULA PO) Take 1 tablet by mouth daily.     olmesartan (BENICAR) 20 MG tablet Take 20 mg by mouth daily.     Omega-3 Fatty Acids (FISH OIL) 1000 MG CAPS Take 1 capsule by mouth daily.     promethazine-dextromethorphan (PROMETHAZINE-DM) 6.25-15 MG/5ML syrup Take 5 mLs by mouth every 4 (four) hours as needed.     RIVAROXABAN  (XARELTO ) VTE STARTER PACK (15 & 20 MG) Take 1 Package by mouth as directed. Follow package directions: Take one 15mg  tablet by mouth twice a day. On day 22, switch to one 20mg  tablet once a day. Take with food. 51 each 0   TRELEGY ELLIPTA 100-62.5-25 MCG/ACT AEPB Take 1 puff by mouth daily.     albuterol (VENTOLIN HFA) 108 (90 Base) MCG/ACT inhaler Inhale 1-2 puffs into the lungs every 6 (six) hours as needed.     ibuprofen (ADVIL,MOTRIN) 200 MG tablet Take 800 mg by mouth every 6 (six) hours as needed. (Patient not taking: Reported on 09/16/2024)     No current facility-administered medications on file prior to visit.   REVIEW OF SYSTEMS:  Review of Systems  Respiratory:  Negative for shortness of breath.    Cardiovascular:  Positive for leg swelling. Negative for chest pain.   PHYSICAL EXAMINATION:  Vitals:   09/16/24 1105  BP: 108/75  Pulse: 75  SpO2: 95%  Weight: 185 lb 4.8 oz (84.1 kg)    Body mass index is 33.89 kg/m.  Physical Exam Vitals reviewed.  Pulmonary:     Effort: Pulmonary effort is normal.  Musculoskeletal:        General: Tenderness present.     Right lower leg: Edema (medial thigh) present.  Neurological:     Mental Status: She is alert.  Psychiatric:        Mood and Affect: Mood normal.   LABS:  CBC     Component Value Date/Time   WBC 6.8 06/05/2017 0750   RBC 4.70 06/05/2017 0750   HGB 14.9 06/05/2017 0750   HCT 42.8 06/05/2017 0750   PLT 271 06/05/2017 0750  MCV 91.1 06/05/2017 0750   MCH 31.7 06/05/2017 0750   MCHC 34.8 06/05/2017 0750   RDW 13.3 06/05/2017 0750   LYMPHSABS 2.6 06/05/2017 0750   MONOABS 0.5 06/05/2017 0750   EOSABS 0.1 06/05/2017 0750   BASOSABS 0.1 06/05/2017 0750    Hepatic Function      Component Value Date/Time   PROT 7.0 06/05/2017 0750   ALBUMIN 4.0 06/05/2017 0750   AST 19 06/05/2017 0750   ALT 13 (L) 06/05/2017 0750   ALKPHOS 59 06/05/2017 0750   BILITOT 0.6 06/05/2017 0750    Renal Function   Lab Results  Component Value Date   CREATININE 0.69 06/05/2017    CrCl cannot be calculated (Patient's most recent lab result is older than the maximum 21 days allowed.).   [facility] Vascular Lab Studies:  09/11/24 US  Venous Img Lower  Left (DVT Study): IMPRESSION: 1. No evidence of DVT in the left lower extremity. 2. Superficial thrombophlebitis involving the greater saphenous vein in the distal thigh.  ASSESSMENT: Patient without prior history of DVT diagnosed with superficial vein thrombosis involving the left greater saphenous vein in the distal thigh. She was started on Xarelto  VTE dosing by the ED.  Risk factors include smoking and varicose veins. She has an area of swelling, redness, and warmth on her left medial  thigh today. Since starting on Xarelto  she has noticed improvement in swelling. Discussed the patient with Dr. Pearline who recommended treatment with Xarelto  VTE dosing for a total of 45 days. No recent labs available so will obtain updated CBC and CMP. She used the 1 month free card to fill the Xarelto  starter pack. A 15-day supply of Xarelto  with her insurance is $85. ThXarelto  copay card requires medication be filled for a 30 day supply. Ordered a 30 day supply of Xarelto  and activated the copay card so that she is able to fill remainder of treatment for $10. Counseled the patient to discontinue the medication on 10/29/2024 as she will have completed 45 days of treatment. Per Dr. Pearline, offered follow up with MD/PA for varicose veins, but patient declined. Measured her for new compression stockings today and counseled on the importance of compression/elevation. Extensively counseled on Xarelto  and the need to take with a meal for proper absorption. Offered follow up in DVT Clinic around the time of end of treatment and she preferred to follow up with her PCP which is appropriate. She will reach out to us  if she has any concerns or worsening/new symptoms. All of her questions were answered and she confirmed understanding of the plan.   PLAN: -Continue rivaroxaban  (Xarelto ) 15 mg twice daily with food for 21 days followed by 20 mg daily with food. -Expected duration of therapy: 45 days. Therapy started on 09/13/24. -Patient educated on purpose, proper use and potential adverse effects of rivaroxaban  (Xarelto ). -Discussed importance of taking medication around the same time every day. -Advised patient of medications to avoid (NSAIDs, aspirin doses >100 mg daily). -Educated that Tylenol  (acetaminophen ) is the preferred analgesic to lower the risk of bleeding. -Advised patient to alert all providers of anticoagulation therapy prior to starting a new medication or having a procedure. -Emphasized importance  of monitoring for signs and symptoms of bleeding (abnormal bruising, prolonged bleeding, nose bleeds, bleeding from gums, discolored urine, black tarry stools). -Educated patient to present to the ED if emergent signs and symptoms of new thrombosis occur. -Measured patient for compression stockings. -Counseled patient to wear compression stockings daily, removing at night.  Follow up: with PCP, DVT clinic available as needed  Izetta Henry, PharmD [facility] [phone]

## 2024-09-16 ENCOUNTER — Ambulatory Visit: Attending: Vascular Surgery | Admitting: Pharmacist

## 2024-09-16 ENCOUNTER — Other Ambulatory Visit (HOSPITAL_COMMUNITY): Payer: Self-pay

## 2024-09-16 VITALS — BP 108/75 | HR 75 | Wt 185.3 lb

## 2024-09-16 DIAGNOSIS — I82890 Acute embolism and thrombosis of other specified veins: Secondary | ICD-10-CM

## 2024-09-16 MED ORDER — RIVAROXABAN 20 MG PO TABS
20.0000 mg | ORAL_TABLET | Freq: Every day | ORAL | 0 refills | Status: AC
  Filled 2024-09-16: qty 30, 30d supply, fill #0

## 2024-09-16 NOTE — Patient Instructions (Signed)
-  Continue rivaroxaban  (Xarelto ) 15 mg twice daily with food for 21 days followed by 20 mg daily with food. You can stop taking the medication on January 3rd as you will have completed 45 days of treatment.  - Let us  know if you would like to schedule a follow up visit for your varicose veins with another provider once your insurance is settled -It is important to take your medication around the same time every day.  -Avoid NSAIDs like ibuprofen (Advil, Motrin) and naproxen (Aleve) as well as aspirin doses over 100 mg daily. -Tylenol  (acetaminophen ) is the preferred over the counter pain medication to lower the risk of bleeding. -Be sure to alert all of your health care providers that you are taking an anticoagulant prior to starting a new medication or having a procedure. -Monitor for signs and symptoms of bleeding (abnormal bruising, prolonged bleeding, nose bleeds, bleeding from gums, discolored urine, black tarry stools). If you have fallen and hit your head OR if your bleeding is severe or not stopping, seek emergency care.  -Go to the emergency room if emergent signs and symptoms of new clot occur (new or worse swelling and pain in an arm or leg, shortness of breath, chest pain, fast or irregular heartbeats, lightheadedness, dizziness, fainting, coughing up blood) or if you experience a significant color change (pale or blue) in the extremity that has the clot. -We recommend you wear compression stockings (20-30 mmHg) as long as you are having swelling or pain. Be sure to purchase the correct size and take them off at night.   If you have any questions or need to reschedule an appointment, please call [phone]. If you are having an emergency, call 911 or present to the nearest emergency room.
